# Patient Record
Sex: Female | Born: 1960
Health system: Southern US, Community
[De-identification: ages and names within clinical notes are randomized; demographics above are authoritative.]

## PROBLEM LIST (undated history)

## (undated) DIAGNOSIS — M722 Plantar fascial fibromatosis: Secondary | ICD-10-CM

## (undated) DIAGNOSIS — F99 Mental disorder, not otherwise specified: Secondary | ICD-10-CM

## (undated) DIAGNOSIS — R112 Nausea with vomiting, unspecified: Secondary | ICD-10-CM

## (undated) DIAGNOSIS — F419 Anxiety disorder, unspecified: Secondary | ICD-10-CM

## (undated) DIAGNOSIS — K219 Gastro-esophageal reflux disease without esophagitis: Secondary | ICD-10-CM

## (undated) DIAGNOSIS — Z9889 Other specified postprocedural states: Secondary | ICD-10-CM

---

## 1998-07-20 ENCOUNTER — Emergency Department (HOSPITAL_COMMUNITY): Admission: EM | Admit: 1998-07-20 | Discharge: 1998-07-20 | Payer: Self-pay | Admitting: Emergency Medicine

## 2001-04-16 ENCOUNTER — Encounter: Admission: RE | Admit: 2001-04-16 | Discharge: 2001-04-16 | Payer: Self-pay | Admitting: Family Medicine

## 2001-04-16 ENCOUNTER — Encounter: Payer: Self-pay | Admitting: Family Medicine

## 2001-08-02 ENCOUNTER — Encounter: Payer: Self-pay | Admitting: Family Medicine

## 2001-08-02 ENCOUNTER — Ambulatory Visit (HOSPITAL_COMMUNITY): Admission: RE | Admit: 2001-08-02 | Discharge: 2001-08-02 | Payer: Self-pay | Admitting: Family Medicine

## 2001-08-07 ENCOUNTER — Encounter: Payer: Self-pay | Admitting: Family Medicine

## 2001-08-07 ENCOUNTER — Ambulatory Visit (HOSPITAL_COMMUNITY): Admission: RE | Admit: 2001-08-07 | Discharge: 2001-08-07 | Payer: Self-pay | Admitting: Family Medicine

## 2002-12-05 ENCOUNTER — Other Ambulatory Visit: Admission: RE | Admit: 2002-12-05 | Discharge: 2002-12-05 | Payer: Self-pay | Admitting: Family Medicine

## 2003-02-04 ENCOUNTER — Encounter: Admission: RE | Admit: 2003-02-04 | Discharge: 2003-02-04 | Payer: Self-pay | Admitting: Family Medicine

## 2003-02-04 ENCOUNTER — Encounter: Payer: Self-pay | Admitting: Family Medicine

## 2003-04-24 ENCOUNTER — Encounter: Admission: RE | Admit: 2003-04-24 | Discharge: 2003-04-24 | Payer: Self-pay | Admitting: Family Medicine

## 2003-04-24 ENCOUNTER — Encounter: Payer: Self-pay | Admitting: Family Medicine

## 2004-02-19 ENCOUNTER — Other Ambulatory Visit: Admission: RE | Admit: 2004-02-19 | Discharge: 2004-02-19 | Payer: Self-pay | Admitting: Family Medicine

## 2004-05-06 ENCOUNTER — Encounter: Admission: RE | Admit: 2004-05-06 | Discharge: 2004-05-06 | Payer: Self-pay | Admitting: Family Medicine

## 2005-04-07 ENCOUNTER — Other Ambulatory Visit: Admission: RE | Admit: 2005-04-07 | Discharge: 2005-04-07 | Payer: Self-pay | Admitting: Family Medicine

## 2005-04-13 ENCOUNTER — Encounter: Admission: RE | Admit: 2005-04-13 | Discharge: 2005-04-13 | Payer: Self-pay | Admitting: Family Medicine

## 2006-06-02 ENCOUNTER — Other Ambulatory Visit: Admission: RE | Admit: 2006-06-02 | Discharge: 2006-06-02 | Payer: Self-pay | Admitting: Family Medicine

## 2006-06-21 ENCOUNTER — Encounter: Admission: RE | Admit: 2006-06-21 | Discharge: 2006-06-21 | Payer: Self-pay | Admitting: Family Medicine

## 2009-06-03 ENCOUNTER — Other Ambulatory Visit: Admission: RE | Admit: 2009-06-03 | Discharge: 2009-06-03 | Payer: Self-pay | Admitting: Family Medicine

## 2010-11-14 ENCOUNTER — Encounter: Payer: Self-pay | Admitting: Family Medicine

## 2011-03-28 ENCOUNTER — Other Ambulatory Visit: Payer: Self-pay | Admitting: *Deleted

## 2011-03-28 ENCOUNTER — Other Ambulatory Visit: Payer: Self-pay | Admitting: Family Medicine

## 2011-03-28 DIAGNOSIS — Z1231 Encounter for screening mammogram for malignant neoplasm of breast: Secondary | ICD-10-CM

## 2011-04-14 ENCOUNTER — Ambulatory Visit: Payer: Self-pay

## 2011-05-18 ENCOUNTER — Ambulatory Visit (HOSPITAL_BASED_OUTPATIENT_CLINIC_OR_DEPARTMENT_OTHER)
Admission: RE | Admit: 2011-05-18 | Payer: BC Managed Care – PPO | Source: Ambulatory Visit | Admitting: Orthopedic Surgery

## 2011-08-22 ENCOUNTER — Other Ambulatory Visit: Payer: Self-pay | Admitting: Physician Assistant

## 2011-08-22 ENCOUNTER — Other Ambulatory Visit (HOSPITAL_COMMUNITY)
Admission: RE | Admit: 2011-08-22 | Discharge: 2011-08-22 | Disposition: A | Discharge status: Home / Self Care | Payer: BC Managed Care – PPO | Source: Ambulatory Visit | Attending: Family Medicine | Admitting: Family Medicine

## 2011-08-22 DIAGNOSIS — Z0000 Encounter for general adult medical examination without abnormal findings: Secondary | ICD-10-CM | POA: Insufficient documentation

## 2012-04-09 ENCOUNTER — Ambulatory Visit: Payer: BC Managed Care – PPO

## 2012-07-09 ENCOUNTER — Other Ambulatory Visit: Payer: Self-pay | Admitting: Family Medicine

## 2012-07-09 DIAGNOSIS — Z1231 Encounter for screening mammogram for malignant neoplasm of breast: Secondary | ICD-10-CM

## 2012-07-11 ENCOUNTER — Ambulatory Visit
Admission: RE | Admit: 2012-07-11 | Discharge: 2012-07-11 | Disposition: A | Discharge status: Home / Self Care | Payer: BC Managed Care – PPO | Source: Ambulatory Visit | Attending: Family Medicine | Admitting: Family Medicine

## 2012-07-11 DIAGNOSIS — Z1231 Encounter for screening mammogram for malignant neoplasm of breast: Secondary | ICD-10-CM

## 2013-04-09 ENCOUNTER — Encounter (HOSPITAL_COMMUNITY): Payer: Self-pay | Admitting: Pharmacy Technician

## 2013-04-09 NOTE — Progress Notes (Signed)
Surgery scheduled for 04/15/13.  Preop 04/12/13 at 1100.  Need orders in EPIC.  Thank You.

## 2013-04-09 NOTE — Progress Notes (Signed)
Need orders in EPIC.  Patient scheduled for surgery on 04/15/13.  Thank You.

## 2013-04-10 ENCOUNTER — Other Ambulatory Visit: Payer: Self-pay | Admitting: Orthopedic Surgery

## 2013-04-10 MED ORDER — DEXAMETHASONE SODIUM PHOSPHATE 10 MG/ML IJ SOLN: 10.0000 mg | Freq: Once | INTRAMUSCULAR | Status: DC

## 2013-04-11 ENCOUNTER — Other Ambulatory Visit (HOSPITAL_COMMUNITY): Payer: Self-pay | Admitting: Orthopedic Surgery

## 2013-04-11 NOTE — Patient Instructions (Addendum)
20 JHADA RISK  04/11/2013   Your procedure is scheduled on: 04-15-13  Report to [facility] at 145 pm  Call this number if you have problems the morning of surgery [phone]   Remember:   Do not eat food  :After Midnight.   clear liquids midnight until 1015 am day of surgery, then nothing by mouth.   Take these medicines the morning of surgery with A SIP OF WATER: effexor, ativan if needed, allegra if needed                                SEE [facility] PREPARING FOR SURGERY SHEET   Do not wear jewelry, make-up or nail polish.  Do not wear lotions, powders, or perfumes. You may wear deodorant.   Men may shave face and neck.  Do not bring valuables to the hospital. [facility] IS NOT RESPONSIBLE FOR VALUEABLES.  Contacts, dentures or bridgework may not be worn into surgery.  Leave suitcase in the car. After surgery it may be brought to your room.  For patients admitted to the hospital, checkout time is 11:00 AM the day of discharge.   Patients discharged the day of surgery will not be allowed to drive home.  Name and phone number of your driver:  Special Instructions: N/A   Please read over the following fact sheets that you were given: MRSA Information, incentive spirometer fact sheet  Call Cain Sieve RN pre op nurse if needed [phone]    FAILURE TO FOLLOW THESE INSTRUCTIONS MAY RESULT IN THE CANCELLATION OF YOUR SURGERY.  PATIENT SIGNATURE___________________________________________  NURSE SIGNATURE_____________________________________________

## 2013-04-12 ENCOUNTER — Encounter (HOSPITAL_COMMUNITY): Payer: Self-pay

## 2013-04-12 ENCOUNTER — Encounter (HOSPITAL_COMMUNITY)
Admission: RE | Admit: 2013-04-12 | Discharge: 2013-04-12 | Disposition: A | Discharge status: Home / Self Care | Payer: BC Managed Care – PPO | Source: Ambulatory Visit | Attending: Orthopedic Surgery | Admitting: Orthopedic Surgery

## 2013-04-12 HISTORY — DX: Gastro-esophageal reflux disease without esophagitis: K21.9

## 2013-04-12 HISTORY — DX: Plantar fascial fibromatosis: M72.2

## 2013-04-12 HISTORY — DX: Mental disorder, not otherwise specified: F99

## 2013-04-12 HISTORY — DX: Anxiety disorder, unspecified: F41.9

## 2013-04-12 HISTORY — DX: Other specified postprocedural states: R11.2

## 2013-04-12 HISTORY — DX: Nausea with vomiting, unspecified: Z98.890

## 2013-04-12 LAB — CBC
MCH: 27.4 pg (ref 26.0–34.0)
MCHC: 33.1 g/dL (ref 30.0–36.0)
MCV: 82.9 fL (ref 78.0–100.0)
Platelets: 201 10*3/uL (ref 150–400)
RBC: 4.74 MIL/uL (ref 3.87–5.11)
RDW: 13.1 % (ref 11.5–15.5)
WBC: 4 10*3/uL (ref 4.0–10.5)

## 2013-04-12 LAB — SURGICAL PCR SCREEN
MRSA, PCR: NEGATIVE
Staphylococcus aureus: NEGATIVE

## 2013-04-14 NOTE — H&P (Signed)
  CC- SHEYANNE MUNLEY is a 52 y.o. female who presents with bilateral knee pain.  HPI- . Knee Pain: Patient presents with knee pain involving the  bilateral knees. Onset of the symptoms was several months ago. Inciting event: none known. Current symptoms include giving out, pain located medially (both knees), popping sensation and stiffness. Pain is aggravated by going up and down stairs, kneeling, pivoting, rising after sitting, running and squatting.  Patient has had no prior knee problems. Evaluation to date: MRI: abnormal Bilateral medial meniscal tears. Treatment to date: rest.  Past Medical History  Diagnosis Date  . Anxiety   . Mental disorder   . GERD (gastroesophageal reflux disease)     occasional  . Plantar fasciitis of right foot     has orthotics for shoe  . PONV (postoperative nausea and vomiting) 20 yrs ago    had to be put all way to sleep     Past Surgical History  Procedure Laterality Date  . Cesarean section  20 yrs ago    Prior to Admission medications   Medication Sig Start Date End Date Taking? Authorizing Provider  beta carotene w/minerals (OCUVITE) tablet Take 1 tablet by mouth daily.    Historical Provider, MD  cholecalciferol (VITAMIN D) 1000 UNITS tablet Take 1,000 Units by mouth daily.    Historical Provider, MD  fexofenadine (ALLEGRA) 180 MG tablet Take 180 mg by mouth as needed.     Historical Provider, MD  GLUCOSAMINE-CHONDROITIN PO Take 1 tablet by mouth daily.    Historical Provider, MD  Boris Lown Oil 500 MG CAPS Take 1 capsule by mouth daily.    Historical Provider, MD  LORazepam (ATIVAN) 1 MG tablet Take 1 mg by mouth every 6 (six) hours as needed for anxiety.    Historical Provider, MD  naproxen sodium (ANAPROX) 220 MG tablet Take 440 mg by mouth daily.    Historical Provider, MD  venlafaxine XR (EFFEXOR-XR) 75 MG 24 hr capsule Take 75 mg by mouth every morning.    Historical Provider, MD  vitamin B-12 (CYANOCOBALAMIN) 1000 MCG tablet Take 1,000 mcg by  mouth daily.    Historical Provider, MD   Bilateral Knee Exam antalgic gait, soft tissue tenderness over medial joint line bilateral knees, negative drawer sign, collateral ligaments intact, normal ipsilateral hip exam  Physical Examination: General appearance - alert, well appearing, and in no distress Mental status - alert, oriented to person, place, and time Chest - clear to auscultation, no wheezes, rales or rhonchi, symmetric air entry Heart - normal rate, regular rhythm, normal S1, S2, no murmurs, rubs, clicks or gallops Abdomen - soft, nontender, nondistended, no masses or organomegaly Musculoskeletal - no joint tenderness, deformity or swelling   Asessment/Plan--- Bilateral knee medial meniscal tears- - Plan Bilateral knee arthroscopy with meniscal debridement. Procedure risks and potential comps discussed with patient who elects to proceed. Goals are decreased pain and increased function with a high likelihood of achieving both

## 2013-04-15 ENCOUNTER — Observation Stay (HOSPITAL_COMMUNITY)
Admission: RE | Admit: 2013-04-15 | Discharge: 2013-04-17 | Disposition: A | Discharge status: Home / Self Care | Payer: BC Managed Care – PPO | Source: Ambulatory Visit | Attending: Orthopedic Surgery | Admitting: Orthopedic Surgery

## 2013-04-15 ENCOUNTER — Encounter (HOSPITAL_COMMUNITY)
Admission: RE | Disposition: A | Discharge status: Home / Self Care | Payer: Self-pay | Source: Ambulatory Visit | Attending: Orthopedic Surgery

## 2013-04-15 ENCOUNTER — Encounter (HOSPITAL_COMMUNITY): Payer: Self-pay | Admitting: *Deleted

## 2013-04-15 ENCOUNTER — Ambulatory Visit (HOSPITAL_COMMUNITY): Payer: BC Managed Care – PPO | Admitting: Anesthesiology

## 2013-04-15 ENCOUNTER — Encounter (HOSPITAL_COMMUNITY): Payer: Self-pay | Admitting: Anesthesiology

## 2013-04-15 DIAGNOSIS — M23329 Other meniscus derangements, posterior horn of medial meniscus, unspecified knee: Principal | ICD-10-CM | POA: Insufficient documentation

## 2013-04-15 DIAGNOSIS — S83249A Other tear of medial meniscus, current injury, unspecified knee, initial encounter: Secondary | ICD-10-CM

## 2013-04-15 DIAGNOSIS — K219 Gastro-esophageal reflux disease without esophagitis: Secondary | ICD-10-CM | POA: Insufficient documentation

## 2013-04-15 DIAGNOSIS — M2390 Unspecified internal derangement of unspecified knee: Secondary | ICD-10-CM | POA: Insufficient documentation

## 2013-04-15 DIAGNOSIS — M2340 Loose body in knee, unspecified knee: Secondary | ICD-10-CM | POA: Insufficient documentation

## 2013-04-15 DIAGNOSIS — Z01812 Encounter for preprocedural laboratory examination: Secondary | ICD-10-CM | POA: Insufficient documentation

## 2013-04-15 DIAGNOSIS — IMO0002 Reserved for concepts with insufficient information to code with codable children: Secondary | ICD-10-CM | POA: Insufficient documentation

## 2013-04-15 DIAGNOSIS — Z9889 Other specified postprocedural states: Secondary | ICD-10-CM

## 2013-04-15 DIAGNOSIS — X58XXXA Exposure to other specified factors, initial encounter: Secondary | ICD-10-CM | POA: Insufficient documentation

## 2013-04-15 DIAGNOSIS — Z79899 Other long term (current) drug therapy: Secondary | ICD-10-CM | POA: Insufficient documentation

## 2013-04-15 HISTORY — PX: KNEE ARTHROSCOPY: SHX127

## 2013-04-15 HISTORY — DX: Other tear of medial meniscus, current injury, unspecified knee, initial encounter: S83.249A

## 2013-04-15 SURGERY — ARTHROSCOPY, KNEE, BILATERAL
Anesthesia: General | Site: Knee | Laterality: Bilateral | Wound class: Clean

## 2013-04-15 MED ORDER — CEFAZOLIN SODIUM-DEXTROSE 2-3 GM-% IV SOLR
2.0000 g | INTRAVENOUS | Status: AC
  Administered 2013-04-15: 2 g via INTRAVENOUS

## 2013-04-15 MED ORDER — LIDOCAINE HCL (CARDIAC) 20 MG/ML IV SOLN
INTRAVENOUS | Status: DC | PRN
  Administered 2013-04-15: 75 mg via INTRAVENOUS

## 2013-04-15 MED ORDER — OXYCODONE HCL 5 MG PO TABS
5.0000 mg | ORAL_TABLET | ORAL | Status: DC | PRN
  Administered 2013-04-15 – 2013-04-17 (×12): 10 mg via ORAL
  Filled 2013-04-15 (×13): qty 2

## 2013-04-15 MED ORDER — FENTANYL CITRATE 0.05 MG/ML IJ SOLN: 25.0000 ug | INTRAMUSCULAR | Status: DC | PRN

## 2013-04-15 MED ORDER — LORAZEPAM 1 MG PO TABS
1.0000 mg | ORAL_TABLET | Freq: Four times a day (QID) | ORAL | Status: DC | PRN
  Administered 2013-04-15 – 2013-04-16 (×2): 1 mg via ORAL
  Filled 2013-04-15 (×3): qty 1

## 2013-04-15 MED ORDER — BUPIVACAINE-EPINEPHRINE 0.25% -1:200000 IJ SOLN
INTRAMUSCULAR | Status: AC
  Filled 2013-04-15: qty 1

## 2013-04-15 MED ORDER — METOCLOPRAMIDE HCL 10 MG PO TABS: 5.0000 mg | ORAL_TABLET | Freq: Three times a day (TID) | ORAL | Status: DC | PRN

## 2013-04-15 MED ORDER — ONDANSETRON HCL 4 MG/2ML IJ SOLN
INTRAMUSCULAR | Status: DC | PRN
  Administered 2013-04-15 (×2): 2 mg via INTRAVENOUS

## 2013-04-15 MED ORDER — LACTATED RINGERS IV SOLN
INTRAVENOUS | Status: DC | PRN
  Administered 2013-04-15 (×2): via INTRAVENOUS

## 2013-04-15 MED ORDER — MORPHINE SULFATE 2 MG/ML IJ SOLN
1.0000 mg | INTRAMUSCULAR | Status: DC | PRN
  Administered 2013-04-16: 1 mg via INTRAVENOUS
  Filled 2013-04-15: qty 1

## 2013-04-15 MED ORDER — ONDANSETRON HCL 4 MG/2ML IJ SOLN: 4.0000 mg | Freq: Four times a day (QID) | INTRAMUSCULAR | Status: DC | PRN

## 2013-04-15 MED ORDER — MEPERIDINE HCL 50 MG/ML IJ SOLN
INTRAMUSCULAR | Status: AC
  Filled 2013-04-15: qty 1

## 2013-04-15 MED ORDER — CITRIC ACID-SODIUM CITRATE 334-500 MG/5ML PO SOLN
30.0000 mL | Freq: Once | ORAL | Status: AC
  Administered 2013-04-15: 30 mL via ORAL
  Filled 2013-04-15: qty 30

## 2013-04-15 MED ORDER — MIDAZOLAM HCL 5 MG/5ML IJ SOLN
INTRAMUSCULAR | Status: DC | PRN
  Administered 2013-04-15 (×2): 1 mg via INTRAVENOUS

## 2013-04-15 MED ORDER — SODIUM CHLORIDE 0.9 % IV SOLN
INTRAVENOUS | Status: DC
  Administered 2013-04-15: 19:00:00 via INTRAVENOUS

## 2013-04-15 MED ORDER — CITRIC ACID-SODIUM CITRATE 334-500 MG/5ML PO SOLN
ORAL | Status: AC
  Filled 2013-04-15: qty 15

## 2013-04-15 MED ORDER — METOCLOPRAMIDE HCL 5 MG/ML IJ SOLN: 5.0000 mg | Freq: Three times a day (TID) | INTRAMUSCULAR | Status: DC | PRN

## 2013-04-15 MED ORDER — LACTATED RINGERS IR SOLN
Status: DC | PRN
  Administered 2013-04-15 (×3): 3000 mL
  Administered 2013-04-15 (×2): 6000 mL

## 2013-04-15 MED ORDER — PROPOFOL INFUSION 10 MG/ML OPTIME
INTRAVENOUS | Status: DC | PRN
  Administered 2013-04-15: 75 ug/kg/min via INTRAVENOUS

## 2013-04-15 MED ORDER — LACTATED RINGERS IV SOLN
INTRAVENOUS | Status: DC
  Administered 2013-04-15: 1000 mL via INTRAVENOUS

## 2013-04-15 MED ORDER — LORATADINE 10 MG PO TABS
10.0000 mg | ORAL_TABLET | Freq: Every day | ORAL | Status: DC
  Administered 2013-04-16 – 2013-04-17 (×2): 10 mg via ORAL
  Filled 2013-04-15 (×2): qty 1

## 2013-04-15 MED ORDER — METOCLOPRAMIDE HCL 5 MG/ML IJ SOLN
INTRAMUSCULAR | Status: DC | PRN
  Administered 2013-04-15: 5 mg via INTRAVENOUS

## 2013-04-15 MED ORDER — DEXAMETHASONE SODIUM PHOSPHATE 10 MG/ML IJ SOLN
INTRAMUSCULAR | Status: DC | PRN
  Administered 2013-04-15: 10 mg via INTRAVENOUS

## 2013-04-15 MED ORDER — CEFAZOLIN SODIUM-DEXTROSE 2-3 GM-% IV SOLR
INTRAVENOUS | Status: AC
  Filled 2013-04-15: qty 50

## 2013-04-15 MED ORDER — MEPERIDINE HCL 50 MG/ML IJ SOLN
6.2500 mg | INTRAMUSCULAR | Status: DC | PRN
  Administered 2013-04-15 (×2): 12.5 mg via INTRAVENOUS

## 2013-04-15 MED ORDER — SODIUM CHLORIDE 0.9 % IV SOLN: INTRAVENOUS | Status: DC

## 2013-04-15 MED ORDER — BUPIVACAINE-EPINEPHRINE PF 0.25-1:200000 % IJ SOLN
INTRAMUSCULAR | Status: DC | PRN
  Administered 2013-04-15: 20 mL

## 2013-04-15 MED ORDER — SUCCINYLCHOLINE CHLORIDE 20 MG/ML IJ SOLN
INTRAMUSCULAR | Status: DC | PRN
  Administered 2013-04-15: 100 mg via INTRAVENOUS

## 2013-04-15 MED ORDER — PROMETHAZINE HCL 25 MG/ML IJ SOLN: 6.2500 mg | INTRAMUSCULAR | Status: DC | PRN

## 2013-04-15 MED ORDER — FENTANYL CITRATE 0.05 MG/ML IJ SOLN
INTRAMUSCULAR | Status: DC | PRN
  Administered 2013-04-15 (×3): 50 ug via INTRAVENOUS
  Administered 2013-04-15: 100 ug via INTRAVENOUS
  Administered 2013-04-15 (×3): 50 ug via INTRAVENOUS

## 2013-04-15 MED ORDER — METHOCARBAMOL 100 MG/ML IJ SOLN
500.0000 mg | Freq: Four times a day (QID) | INTRAMUSCULAR | Status: DC | PRN
  Filled 2013-04-15: qty 5

## 2013-04-15 MED ORDER — METHOCARBAMOL 500 MG PO TABS
500.0000 mg | ORAL_TABLET | Freq: Four times a day (QID) | ORAL | Status: DC | PRN
  Administered 2013-04-16 – 2013-04-17 (×4): 500 mg via ORAL
  Filled 2013-04-15 (×4): qty 1

## 2013-04-15 MED ORDER — ACETAMINOPHEN 10 MG/ML IV SOLN
INTRAVENOUS | Status: AC
  Filled 2013-04-15: qty 100

## 2013-04-15 MED ORDER — ONDANSETRON HCL 4 MG PO TABS: 4.0000 mg | ORAL_TABLET | Freq: Four times a day (QID) | ORAL | Status: DC | PRN

## 2013-04-15 MED ORDER — ACETAMINOPHEN 10 MG/ML IV SOLN: 1000.0000 mg | Freq: Once | INTRAVENOUS | Status: DC

## 2013-04-15 MED ORDER — LACTATED RINGERS IV SOLN: INTRAVENOUS | Status: DC

## 2013-04-15 MED ORDER — ENOXAPARIN SODIUM 40 MG/0.4ML ~~LOC~~ SOLN
40.0000 mg | SUBCUTANEOUS | Status: DC
  Administered 2013-04-16: 40 mg via SUBCUTANEOUS
  Filled 2013-04-15 (×3): qty 0.4

## 2013-04-15 MED ORDER — VENLAFAXINE HCL ER 75 MG PO CP24
75.0000 mg | ORAL_CAPSULE | Freq: Every morning | ORAL | Status: DC
  Administered 2013-04-16 – 2013-04-17 (×2): 75 mg via ORAL
  Filled 2013-04-15 (×2): qty 1

## 2013-04-15 SURGICAL SUPPLY — 34 items
BANDAGE ELASTIC 6 VELCRO ST LF (GAUZE/BANDAGES/DRESSINGS) ×4 IMPLANT
BLADE 4.2CUDA (BLADE) IMPLANT
BNDG COHESIVE 6X5 TAN STRL LF (GAUZE/BANDAGES/DRESSINGS) ×2 IMPLANT
CLOTH BEACON ORANGE TIMEOUT ST (SAFETY) ×2 IMPLANT
CUFF TOURN SGL QUICK 34 (TOURNIQUET CUFF) ×2
CUFF TRNQT CYL 34X4X40X1 (TOURNIQUET CUFF) ×2 IMPLANT
DECANTER SPIKE VIAL GLASS SM (MISCELLANEOUS) ×2 IMPLANT
DRAPE EXTREMITY BILATERAL (DRAPE) ×2 IMPLANT
DRAPE STERI 35X30 U-POUCH (DRAPES) ×4 IMPLANT
DRSG ADAPTIC 3X8 NADH LF (GAUZE/BANDAGES/DRESSINGS) ×4 IMPLANT
DRSG EMULSION OIL 3X3 NADH (GAUZE/BANDAGES/DRESSINGS) ×4 IMPLANT
DRSG PAD ABDOMINAL 8X10 ST (GAUZE/BANDAGES/DRESSINGS) ×4 IMPLANT
DURAPREP 26ML APPLICATOR (WOUND CARE) ×4 IMPLANT
ELECT REM PT RETURN 9FT ADLT (ELECTROSURGICAL) ×2
ELECTRODE REM PT RTRN 9FT ADLT (ELECTROSURGICAL) ×1 IMPLANT
GLOVE BIO SURGEON STRL SZ7.5 (GLOVE) ×4 IMPLANT
GLOVE BIO SURGEON STRL SZ8 (GLOVE) ×4 IMPLANT
GLOVE INDICATOR 8.0 STRL GRN (GLOVE) ×4 IMPLANT
GOWN STRL REIN XL XLG (GOWN DISPOSABLE) ×4 IMPLANT
MANIFOLD NEPTUNE II (INSTRUMENTS) ×4 IMPLANT
PACK ARTHROSCOPY WL (CUSTOM PROCEDURE TRAY) ×2 IMPLANT
PAD CAST 4YDX4 CTTN HI CHSV (CAST SUPPLIES) ×2 IMPLANT
PADDING CAST COTTON 4X4 STRL (CAST SUPPLIES) ×2
PADDING CAST COTTON 6X4 STRL (CAST SUPPLIES) ×4 IMPLANT
POSITIONER SURGICAL ARM (MISCELLANEOUS) ×2 IMPLANT
SET ARTHROSCOPY TUBING (MISCELLANEOUS) ×1
SET ARTHROSCOPY TUBING LN (MISCELLANEOUS) ×1 IMPLANT
SPONGE GAUZE 4X4 12PLY (GAUZE/BANDAGES/DRESSINGS) ×4 IMPLANT
SUT ETHILON 4 0 PS 2 18 (SUTURE) ×4 IMPLANT
SYR 20CC LL (SYRINGE) ×2 IMPLANT
TOWEL OR 17X26 10 PK STRL BLUE (TOWEL DISPOSABLE) ×2 IMPLANT
TUBING CONNECTING 10 (TUBING) ×2 IMPLANT
WAND 90 DEG TURBOVAC W/CORD (SURGICAL WAND) ×2 IMPLANT
WRAP KNEE MAXI GEL POST OP (GAUZE/BANDAGES/DRESSINGS) ×4 IMPLANT

## 2013-04-15 NOTE — Transfer of Care (Signed)
Immediate Anesthesia Transfer of Care Note  Patient: Elizabeth Harvey  Procedure(s) Performed: Procedure(s) with comments: ARTHROSCOPY KNEE BILATERAL WITH DEBRIDEMENT (Bilateral) - Medial meniscal debridement and chondroplasty bilateral knees  Patient Location: PACU  Anesthesia Type:General  Level of Consciousness: awake, alert , oriented and patient cooperative  Airway & Oxygen Therapy: Patient Spontanous Breathing and Patient connected to face mask oxygen  Post-op Assessment: Report given to PACU RN, Post -op Vital signs reviewed and stable and Patient moving all extremities  Post vital signs: Reviewed and stable  Complications: No apparent anesthesia complications

## 2013-04-15 NOTE — Preoperative (Signed)
Beta Blockers   Reason not to administer Beta Blockers:Not Applicable 

## 2013-04-15 NOTE — Anesthesia Postprocedure Evaluation (Signed)
  Anesthesia Post-op Note  Patient: Elizabeth Harvey  Procedure(s) Performed: Procedure(s) (LRB): ARTHROSCOPY KNEE BILATERAL WITH DEBRIDEMENT (Bilateral)  Patient Location: PACU  Anesthesia Type: General  Level of Consciousness: awake and alert   Airway and Oxygen Therapy: Patient Spontanous Breathing  Post-op Pain: mild  Post-op Assessment: Post-op Vital signs reviewed, Patient's Cardiovascular Status Stable, Respiratory Function Stable, Patent Airway and No signs of Nausea or vomiting  Last Vitals:  Filed Vitals:   04/15/13 1803  BP:   Pulse: 97  Temp: 36.8 C  Resp: 13    Post-op Vital Signs: stable   Complications: No apparent anesthesia complications \

## 2013-04-15 NOTE — Anesthesia Preprocedure Evaluation (Signed)
Anesthesia Evaluation  Patient identified by MRN, date of birth, ID band Patient awake    Reviewed: Allergy & Precautions, H&P , NPO status , Patient's Chart, lab work & pertinent test results  History of Anesthesia Complications Negative for: history of anesthetic complications  Airway Mallampati: II TM Distance: >3 FB Neck ROM: Full    Dental no notable dental hx.    Pulmonary neg pulmonary ROS,  breath sounds clear to auscultation  Pulmonary exam normal       Cardiovascular negative cardio ROS  Rhythm:Regular Rate:Normal     Neuro/Psych negative neurological ROS  negative psych ROS   GI/Hepatic Neg liver ROS, GERD-  Poorly Controlled,  Endo/Other  negative endocrine ROS  Renal/GU negative Renal ROS  negative genitourinary   Musculoskeletal negative musculoskeletal ROS (+)   Abdominal   Peds negative pediatric ROS (+)  Hematology negative hematology ROS (+)   Anesthesia Other Findings   Reproductive/Obstetrics negative OB ROS                           Anesthesia Physical Anesthesia Plan  ASA: II  Anesthesia Plan: General   Post-op Pain Management:    Induction: Intravenous, Rapid sequence and Cricoid pressure planned  Airway Management Planned: Oral ETT  Additional Equipment:   Intra-op Plan:   Post-operative Plan: Extubation in OR  Informed Consent: I have reviewed the patients History and Physical, chart, labs and discussed the procedure including the risks, benefits and alternatives for the proposed anesthesia with the patient or authorized representative who has indicated his/her understanding and acceptance.   Dental advisory given  Plan Discussed with: CRNA  Anesthesia Plan Comments:         Anesthesia Quick Evaluation

## 2013-04-15 NOTE — Brief Op Note (Signed)
04/15/2013  5:47 PM  PATIENT:  Elizabeth Harvey  52 y.o. female  PRE-OPERATIVE DIAGNOSIS:  Bilateral Meniscal Tear  POST-OPERATIVE DIAGNOSIS:  Bilateral Meniscal Tear  PROCEDURE:  Procedure(s) with comments: ARTHROSCOPY KNEE BILATERAL WITH DEBRIDEMENT (Bilateral) - Medial meniscal debridement and chondroplasty bilateral knees  SURGEON:  Surgeon(s) and Role:    * Loanne Drilling, MD - Primary  PHYSICIAN ASSISTANT:   ASSISTANTS: none   ANESTHESIA:   general  EBL:  Total I/O In: 1000 [I.V.:1000] Out: 150 [Other:150]   LOCAL MEDICATIONS USED:  MARCAINE     COUNTS:  YES  TOURNIQUET:    DICTATION: .Other Dictation: Dictation Number [id number]  PLAN OF CARE: Admit for overnight observation  PATIENT DISPOSITION:  PACU - hemodynamically stable.

## 2013-04-15 NOTE — Interval H&P Note (Signed)
History and Physical Interval Note:  04/15/2013 3:52 PM  Elizabeth Harvey  has presented today for surgery, with the diagnosis of Bilateral Meniscal Tear  The various methods of treatment have been discussed with the patient and family. After consideration of risks, benefits and other options for treatment, the patient has consented to  Procedure(s): ARTHROSCOPY KNEE BILATERAL WITH DEBRIDEMENT (Bilateral) as a surgical intervention .  The patient's history has been reviewed, patient examined, no change in status, stable for surgery.  I have reviewed the patient's chart and labs.  Questions were answered to the patient's satisfaction.     Loanne Drilling

## 2013-04-15 NOTE — Op Note (Signed)
Elizabeth Harvey, Elizabeth Harvey                 ACCOUNT NO.:  [id number]  MEDICAL RECORD NO.:  [id number]  LOCATION:  1601                         FACILITY:  [facility]  PHYSICIAN:  Ollen Gross, M.D.    DATE OF BIRTH:  Dec 19, 1960  DATE OF PROCEDURE:  04/15/2013 DATE OF DISCHARGE:                              OPERATIVE REPORT   PREOPERATIVE DIAGNOSIS:  Bilateral knee medial meniscal tear.  POSTOPERATIVE DIAGNOSIS:  Bilateral knee medial meniscal tear plus chondral defects, bilateral knees.  PROCEDURE:  Bilateral knee arthroscopy with medial meniscal debridement and chondroplasty.  SURGEON:  Ollen Gross, M.D.  ASSISTANT:  No assistant.  ANESTHESIA:  General.  ESTIMATED BLOOD LOSS:  Minimal.  DRAINS:  None.  COMPLICATIONS:  None.  CONDITION:  Stable to recovery.  BRIEF CLINICAL NOTE:  Elizabeth Harvey is a 52 year old female, who had a long history of pain and mechanical symptoms in her right knee.  Exam suggested medial meniscal tear, which was confirmed by MRI.  We decided to proceed with a right knee arthroscopy, but then recently she has injured her left knee with significant medial pain and mechanical symptoms.  Recent exam also suggested a medial meniscal tear, which was confirmed on MRI.  Both knees are hurting badly with the left somewhat worse than the right.  She presents now for bilateral knee arthroscopy and debridement.  PROCEDURE IN DETAIL:  After successful administration of general anesthetic, tourniquet was placed high on both thighs, and both lower extremities were prepped and draped in the usual sterile fashion.  We decided to do the left side first since that was more acute.  Standard superomedial and inferolateral incisions were made, inflow cannula passed, superomedial camera passed inferolateral and then arthroscopic visualization proceeded.  Undersurface of patella and trochlea looked normal on first glance, but then looking closer at the inferomedial aspect of the  trochlea was about a 1 x 2 cm area of chondral delamination.  We then visualized the medial lateral gutters.  There were no loose bodies.  Flexion of valgus force was applied to the knee, and the medial compartment was entered.  There was evidence of an unstable tear of the posterior horn of the medial meniscus.  The spinal needle was used to localize the inferomedial portal.  Small incision was made and dilator placed.  The meniscal tear was debrided back to a stable base with baskets and a 4.5 mm shaver.  The tear was then sealed off with the ArthroCare device.  The medial femoral condyle and tibial plateau looked normal.  The intercondylar notch was visualized.  The ACL was normal.  Lateral compartment centered and it looks normal.  We then addressed the chondral defect in the trochlea.  It is debrided back to a stable base with the shaver and then the bony defect was about 1 x 1 cm and that was abraded to give the bleeding surface to lead to formation of fibrocartilage.  The edges of the defect were stable.  Joints were again inspected.  No other tears, defects, or loose bodies were noted. The arthroscopic equipment was then removed from the inferior portals, which were closed by interrupted 4-0 nylon.  20 mL of 0.25%  Marcaine epi injected inflow cannula and that was removed and that portal was closed with nylon.  We then addressed the right knee.  A fresh blade was used make a superomedial inferolateral incision, and an inflow cannula passed, superomedial camera passed inferolateral.  Arthroscopic visualization proceeded.  Undersurface of the patella showed a small chondral defect. It was not unstable.  There was a loose body and an unstable defect that tethered to the superomedial aspect of the trochlea.  The medial lateral and gutters were visualized.  There were no loose bodies.  Flexion and valgus force was applied to the knee and the medial compartment was centered.  Evidence  of an unstable tear of the posterior horn of the medial meniscus.  This was debrided back to stable base with baskets and a 4.2 mm shaver and sealed off with the ArthroCare device.  There were no chondral defects in the medial compartment.  Intercondylar notch was visualized.  The ACL was normal.  Lateral compartment centered and it looked normal.  The patellofemoral joint was again addressed.  A grasper was used to remove the loose body, which had been tethered to the trochlea.  We then used a shaver to debride the trochlear defect, which was about 2 cm in length and about 5 mm in width.  This was debrided to stable bony base with stable cartilaginous edges.  It was probed and found to be stable.  The undersurface of the patella was also probed and was stable.  The joints were again inspected.  No other tears, defects, or loose bodies noted.  Arthroscopic equipments were removed from the inferior portals, which were closed with interrupted 4-0 nylon.  The 20 mL of 0.25% Marcaine with epi was injected with inflow cannula in and out.  Portals closed with nylon.  The incisions were cleaned and dried and on both knees, a bulky sterile dressing was applied.  She was then awakened and transported to recovery in stable condition.     Ollen Gross, M.D.     FA/MEDQ  D:  04/15/2013  T:  04/15/2013  Job:  [id number]

## 2013-04-16 ENCOUNTER — Encounter (HOSPITAL_COMMUNITY): Payer: Self-pay | Admitting: Orthopedic Surgery

## 2013-04-16 LAB — BASIC METABOLIC PANEL
BUN: 7 mg/dL (ref 6–23)
Creatinine, Ser: 0.59 mg/dL (ref 0.50–1.10)
GFR calc Af Amer: >90 mL/min (ref >90)
GFR calc non Af Amer: >90 mL/min (ref >90)
Potassium: 3.9 mEq/L (ref 3.5–5.1)

## 2013-04-16 MED ORDER — RIVAROXABAN 10 MG PO TABS
10.0000 mg | ORAL_TABLET | Freq: Every day | ORAL | Status: DC
  Administered 2013-04-17: 10 mg via ORAL
  Filled 2013-04-16: qty 1

## 2013-04-16 MED ORDER — METHOCARBAMOL 500 MG PO TABS: 500.0000 mg | ORAL_TABLET | Freq: Four times a day (QID) | ORAL | Status: DC | PRN

## 2013-04-16 MED ORDER — OXYCODONE HCL 5 MG PO TABS: 5.0000 mg | ORAL_TABLET | ORAL | Status: DC | PRN

## 2013-04-16 MED ORDER — RIVAROXABAN 10 MG PO TABS: 10.0000 mg | ORAL_TABLET | Freq: Every day | ORAL | Status: DC

## 2013-04-16 MED ORDER — KETOROLAC TROMETHAMINE 30 MG/ML IJ SOLN
30.0000 mg | Freq: Once | INTRAMUSCULAR | Status: AC
  Administered 2013-04-16: 30 mg via INTRAVENOUS
  Filled 2013-04-16: qty 1

## 2013-04-16 NOTE — Discharge Summary (Signed)
Physician Discharge Summary   Patient ID: Elizabeth Harvey MRN: [id number] DOB/AGE: 03/28/1961 52 y.o.  Admit date: 04/15/2013 Discharge date: 04/16/2013  Primary Diagnosis:  Bilateral Meniscal Tear  Admission Diagnoses:  Past Medical History  Diagnosis Date  . Anxiety   . Mental disorder   . GERD (gastroesophageal reflux disease)     occasional  . Plantar fasciitis of right foot     has orthotics for shoe  . PONV (postoperative nausea and vomiting) 20 yrs ago    had to be put all way to sleep    Discharge Diagnoses:   Principal Problem:   Acute medial meniscal tear  Estimated body mass index is 36.28 kg/(m^2) as calculated from the following:   Height as of this encounter: 5\' 5"  (1.651 m).   Weight as of this encounter: 98.884 kg (218 lb).  Procedure:  Procedure(s) (LRB): ARTHROSCOPY KNEE BILATERAL WITH DEBRIDEMENT (Bilateral)   Consults: None  HPI: Elizabeth Harvey is a 52 year old female, who had a long  history of pain and mechanical symptoms in her right knee. Exam  suggested medial meniscal tear, which was confirmed by MRI. We decided  to proceed with a right knee arthroscopy, but then recently she has  injured her left knee with significant medial pain and mechanical  symptoms. Recent exam also suggested a medial meniscal tear, which was  confirmed on MRI. Both knees are hurting badly with the left somewhat  worse than the right. She presents now for bilateral knee arthroscopy  and debridement.  Laboratory Data: Hospital Outpatient Visit on 04/12/2013  Component Date Value Range Status  . WBC 04/12/2013 4.0  4.0 - 10.5 K/uL Final  . RBC 04/12/2013 4.74  3.87 - 5.11 MIL/uL Final  . Hemoglobin 04/12/2013 13.0  12.0 - 15.0 g/dL Final  . HCT 14/78/2956 39.3  36.0 - 46.0 % Final  . MCV 04/12/2013 82.9  78.0 - 100.0 fL Final  . MCH 04/12/2013 27.4  26.0 - 34.0 pg Final  . MCHC 04/12/2013 33.1  30.0 - 36.0 g/dL Final  . RDW 21/30/8657 13.1  11.5 - 15.5 % Final  . Platelets  04/12/2013 201  150 - 400 K/uL Final  . MRSA, PCR 04/12/2013 NEGATIVE  NEGATIVE Final  . Staphylococcus aureus 04/12/2013 NEGATIVE  NEGATIVE Final   Comment:                                 The Xpert SA Assay (FDA                          approved for NASAL specimens                          in patients over 105 years of age),                          is one component of                          a comprehensive surveillance                          program.  Test performance has  been validated by [facility] for patients greater                          than or equal to 72 year old.                          It is not intended                          to diagnose infection nor to                          guide or monitor treatment.     X-Rays:No results found.  EKG:No orders found for this or any previous visit.   Hospital Course: Elizabeth Harvey is a 52 y.o. who was admitted to [facility]. They were brought to the operating room on 04/15/2013 and underwent Procedure(s): ARTHROSCOPY KNEE BILATERAL WITH DEBRIDEMENT.  Patient tolerated the procedure well and was later transferred to the recovery room and then to the orthopaedic floor for postoperative care.  They were given PO and IV analgesics for pain control following their surgery.  They were given 24 hours of postoperative antibiotics of  Anti-infectives   Start     Dose/Rate Route Frequency Ordered Stop   04/15/13 1400  ceFAZolin (ANCEF) IVPB 2 g/50 mL premix     2 g 100 mL/hr over 30 Minutes Intravenous On call to O.R. 04/15/13 1311 04/15/13 1615     and started on DVT prophylaxis in the form of Lovenox.  Discharge planning consulted to help with postop disposition and equipment needs.  Patient had a decent night on the evening of surgery.  Family in room with patient.  They started to get up OOB on day one. Patient was seen in rounds on POD 1 by Dr. Lequita Halt and she was ready  to go home later that morning.  Surgical findings were discussed with the patient.  Instructions were given.   Discharge Medications: Prior to Admission medications   Medication Sig Start Date End Date Taking? Authorizing Provider  beta carotene w/minerals (OCUVITE) tablet Take 1 tablet by mouth daily.   Yes Historical Provider, MD  cholecalciferol (VITAMIN D) 1000 UNITS tablet Take 1,000 Units by mouth daily.   Yes Historical Provider, MD  fexofenadine (ALLEGRA) 180 MG tablet Take 180 mg by mouth as needed.    Yes Historical Provider, MD  GLUCOSAMINE-CHONDROITIN PO Take 1 tablet by mouth daily.   Yes Historical Provider, MD  Boris Lown Oil 500 MG CAPS Take 1 capsule by mouth daily.   Yes Historical Provider, MD  LORazepam (ATIVAN) 1 MG tablet Take 1 mg by mouth every 6 (six) hours as needed for anxiety.   Yes Historical Provider, MD  venlafaxine XR (EFFEXOR-XR) 75 MG 24 hr capsule Take 75 mg by mouth every morning.   Yes Historical Provider, MD  vitamin B-12 (CYANOCOBALAMIN) 1000 MCG tablet Take 1,000 mcg by mouth daily.   Yes Historical Provider, MD  methocarbamol (ROBAXIN) 500 MG tablet Take 1 tablet (500 mg total) by mouth every 6 (six) hours as needed. 04/16/13   Alexzandrew Perkins, PA-C  oxyCODONE (OXY IR/ROXICODONE) 5 MG immediate release tablet Take 1-2 tablets (5-10 mg  total) by mouth every 3 (three) hours as needed. 04/16/13   Alexzandrew Julien Girt, PA-C  rivaroxaban (XARELTO) 10 MG TABS tablet Take 1 tablet (10 mg total) by mouth daily. Take Xarelto for one week, then discontinue Xarelto. Once the patient has completed the Xarelto, then take an 81 mg Aspirin for four weeks. 04/17/13   Alexzandrew Julien Girt, PA-C    Diet: Regular diet Activity:WBAT Follow-up:in 7 days Disposition - Home Discharged Condition: good   Discharge Orders   Future Orders Complete By Expires     Call MD / Call 911  As directed     Comments:      If you experience chest pain or shortness of breath, CALL 911 and  be transported to the hospital emergency room.  If you develope a fever above 101 F, pus (white drainage) or increased drainage or redness at the wound, or calf pain, call your surgeon's office.    Change dressing  As directed     Comments:      Change dressing daily with sterile 4 x 4 inch gauze dressing and apply TED hose. Do not submerge the incision under water.    Constipation Prevention  As directed     Comments:      Drink plenty of fluids.  Prune juice may be helpful.  You may use a stool softener, such as Colace (over the counter) 100 mg twice a day.  Use MiraLax (over the counter) for constipation as needed.    Diet general  As directed     Discharge instructions  As directed     Comments:      Pick up stool softner and laxative for home. Do not submerge incision under water. May shower starting Wednesday afternoon. Continue to use ice for pain and swelling from surgery.  Take Xarelto for one week, then discontinue Xarelto. Once the patient has completed the Xarelto, then take an 81 mg Aspirin for four weeks.  Begin knee exercises provided at time of discharge.    Do not sit on low chairs, stoools or toilet seats, as it may be difficult to get up from low surfaces  As directed     Driving restrictions  As directed     Comments:      No driving until released by the physician.    Increase activity slowly as tolerated  As directed     Lifting restrictions  As directed     Comments:      No lifting until released by the physician.    Patient may shower  As directed     Comments:      You may shower without a dressing once there is no drainage.  Do not wash over the wound.  If drainage remains, do not shower until drainage stops.    TED hose  As directed     Comments:      Use stockings (TED hose) for 3 weeks on both leg(s).  You may remove them at night for sleeping.    Weight bearing as tolerated  As directed         Medication List    STOP taking these medications        naproxen sodium 220 MG tablet  Commonly known as:  ANAPROX      TAKE these medications       beta carotene w/minerals tablet  Take 1 tablet by mouth daily.     cholecalciferol 1000 UNITS tablet  Commonly known as:  VITAMIN D  Take 1,000 Units by mouth daily.     fexofenadine 180 MG tablet  Commonly known as:  ALLEGRA  Take 180 mg by mouth as needed.     GLUCOSAMINE-CHONDROITIN PO  Take 1 tablet by mouth daily.     Krill Oil 500 MG Caps  Take 1 capsule by mouth daily.     LORazepam 1 MG tablet  Commonly known as:  ATIVAN  Take 1 mg by mouth every 6 (six) hours as needed for anxiety.     methocarbamol 500 MG tablet  Commonly known as:  ROBAXIN  Take 1 tablet (500 mg total) by mouth every 6 (six) hours as needed.     oxyCODONE 5 MG immediate release tablet  Commonly known as:  Oxy IR/ROXICODONE  Take 1-2 tablets (5-10 mg total) by mouth every 3 (three) hours as needed.     rivaroxaban 10 MG Tabs tablet  Commonly known as:  XARELTO  Take 1 tablet (10 mg total) by mouth daily. Take Xarelto for one week, then discontinue Xarelto.  Once the patient has completed the Xarelto, then take an 81 mg Aspirin for four weeks.  Start taking on:  04/17/2013     venlafaxine XR 75 MG 24 hr capsule  Commonly known as:  EFFEXOR-XR  Take 75 mg by mouth every morning.     vitamin B-12 1000 MCG tablet  Commonly known as:  CYANOCOBALAMIN  Take 1,000 mcg by mouth daily.           Follow-up Information   Follow up with Loanne Drilling, MD. Schedule an appointment as soon as possible for a visit on 04/23/2013. (Call for appointment time.)    Contact information:   [address] [place] [address] [phone]       Signed: Patrica Duel 04/16/2013, 8:25 AM

## 2013-04-16 NOTE — Progress Notes (Signed)
   Subjective: 1 Day Post-Op Procedure(s) (LRB): ARTHROSCOPY KNEE BILATERAL WITH DEBRIDEMENT (Bilateral) Patient reports pain as mild and moderate.   Patient seen in rounds with Dr. Lequita Halt.  Family in room.  Briefly discussed the surgical findings.  Patient is well, and has had no acute complaints or problems Patient is ready to go home later this morning.  Objective: Vital signs in last 24 hours: Temp:  [97.9 F (36.6 C)-98.9 F (37.2 C)] 98.3 F (36.8 C) (06/24 0620) Pulse Rate:  [82-102] 83 (06/24 0620) Resp:  [10-18] 16 (06/24 0620) BP: (90-152)/(54-85) 104/60 mmHg (06/24 0620) SpO2:  [95 %-100 %] 97 % (06/24 0620) Weight:  [98.884 kg (218 lb)] 98.884 kg (218 lb) (06/23 2324)  Intake/Output from previous day:  Intake/Output Summary (Last 24 hours) at 04/16/13 0740 Last data filed at 04/16/13 0700  Gross per 24 hour  Intake   2690 ml  Output   2700 ml  Net    -10 ml     Labs: No results found for this basename: HGB,  in the last 72 hours No results found for this basename: WBC, RBC, HCT, PLT,  in the last 72 hours No results found for this basename: NA, K, CL, CO2, BUN, CREATININE, GLUCOSE, CALCIUM,  in the last 72 hours No results found for this basename: LABPT, INR,  in the last 72 hours  EXAM: General - Patient is Alert, Appropriate and Oriented Extremity - Neurovascular intact Sensation intact distally Dorsiflexion/Plantar flexion intact Dressings - clean, dry, no drainage to both knees Motor Function - intact, moving feet and toes well on exam.   Assessment/Plan: 1 Day Post-Op Procedure(s) (LRB): ARTHROSCOPY KNEE BILATERAL WITH DEBRIDEMENT (Bilateral) Procedure(s) (LRB): ARTHROSCOPY KNEE BILATERAL WITH DEBRIDEMENT (Bilateral) Past Medical History  Diagnosis Date  . Anxiety   . Mental disorder   . GERD (gastroesophageal reflux disease)     occasional  . Plantar fasciitis of right foot     has orthotics for shoe  . PONV (postoperative nausea and  vomiting) 20 yrs ago    had to be put all way to sleep    Principal Problem:   Acute medial meniscal tear  Estimated body mass index is 36.28 kg/(m^2) as calculated from the following:   Height as of this encounter: 5\' 5"  (1.651 m).   Weight as of this encounter: 98.884 kg (218 lb). Discharge home Diet - Regular diet Follow up - in 1 week, next Tuesday.  Call for appointmenmt Activity - WBAT to both legs Disposition - Home Condition Upon Discharge - Good D/C Meds - See DC Summary DVT Prophylaxis - Lovenox injection today, then Xarelto for seven days, the Baby Aspirin 81 mg daily for four more weeks.  PERKINS, ALEXZANDREW 04/16/2013, 7:40 AM

## 2013-04-17 MED ORDER — DOCUSATE SODIUM 100 MG PO CAPS
100.0000 mg | ORAL_CAPSULE | Freq: Once | ORAL | Status: AC
  Administered 2013-04-17: 100 mg via ORAL

## 2013-04-17 MED ORDER — POLYETHYLENE GLYCOL 3350 17 G PO PACK
17.0000 g | PACK | Freq: Once | ORAL | Status: AC
  Administered 2013-04-17: 17 g via ORAL

## 2013-04-17 NOTE — Evaluation (Signed)
Physical Therapy Evaluation Patient Details Name: Elizabeth Harvey MRN: [id number] DOB: 08-May-1961 Today's Date: 04/17/2013 Time: 0950-1030 PT Time Calculation (min): 40 min  PT Assessment / Plan / Recommendation Clinical Impression  Pt s/p bil knee arthroscopy presents with decreased ROM/strength Bil LEs and post op pain limiting functional mobility.  Pt mobilizing at Sup level and to d/c home this date with family assist    PT Assessment  Patent does not need any further PT services    Follow Up Recommendations  No PT follow up    Does the patient have the potential to tolerate intense rehabilitation      Barriers to Discharge        Equipment Recommendations  Rolling walker with 5" wheels    Recommendations for Other Services     Frequency      Precautions / Restrictions Precautions Precautions: Fall Restrictions Weight Bearing Restrictions: No   Pertinent Vitals/Pain 4/10; premed, cold packs provided      Mobility  Bed Mobility Bed Mobility: Supine to Sit Supine to Sit: 5: Supervision Transfers Transfers: Sit to Stand;Stand to Sit Sit to Stand: 4: Min guard;5: Supervision Stand to Sit: 4: Min guard;5: Supervision Details for Transfer Assistance: cues for LE management and use of UEs to self assist Ambulation/Gait Ambulation/Gait Assistance: 4: Min guard;5: Supervision Ambulation Distance (Feet): 75 Feet (and 30) Assistive device: Rolling walker Ambulation/Gait Assistance Details: cues for sequence, posture and position from RW Gait Pattern: Step-to pattern;Step-through pattern Stairs: Yes Stairs Assistance: 4: Min assist Stairs Assistance Details (indicate cue type and reason): cues for sequence and foot/crutch placement Stair Management Technique: One rail Right;Step to pattern;Forwards;With crutches Number of Stairs: 4    Exercises Total Joint Exercises Ankle Circles/Pumps: AROM;15 reps;Supine;Both Quad Sets: AAROM;Supine;10 reps;Both Heel Slides:  AAROM;5 reps;Supine;Both   PT Diagnosis:    PT Problem List:   PT Treatment Interventions:       PT Goals(Current goals can be found in the care plan section) Acute Rehab PT Goals Patient Stated Goal: Be able to squat down to garden PT Goal Formulation: No goals set, d/c therapy  Visit Information  Last PT Received On: 04/17/13 Assistance Needed: +1 History of Present Illness: Bil knee arthroscopy       Prior Functioning  Home Living Family/patient expects to be discharged to:: Private residence Living Arrangements: Spouse/significant other Available Help at Discharge: Family Type of Home: House Home Access: Stairs to enter Secretary/administrator of Steps: 3 Entrance Stairs-Rails: Right;Left Home Layout: Able to live on main level with bedroom/bathroom Home Equipment: None Prior Function Level of Independence: Independent Communication Communication: No difficulties    Cognition  Cognition Arousal/Alertness: Awake/alert Behavior During Therapy: WFL for tasks assessed/performed Overall Cognitive Status: Within Functional Limits for tasks assessed    Extremity/Trunk Assessment Upper Extremity Assessment Upper Extremity Assessment: Overall WFL for tasks assessed Lower Extremity Assessment Lower Extremity Assessment: RLE deficits/detail;LLE deficits/detail RLE Deficits / Details: AAROM at knee to 90 flex with 3+/5 quads LLE Deficits / Details: AAROM to 40 with 3-/5 quads Cervical / Trunk Assessment Cervical / Trunk Assessment: Normal   Balance    End of Session PT - End of Session Equipment Utilized During Treatment: Gait belt Activity Tolerance: Patient tolerated treatment well Patient left: in chair;with call bell/phone within reach;with family/visitor present Nurse Communication: Mobility status  GP Functional Assessment Tool Used: Clinical judgement Functional Limitation: Mobility: Walking and moving around Mobility: Walking and Moving Around Current Status  ([id number]): At least 20 percent but less  than 40 percent impaired, limited or restricted Mobility: Walking and Moving Around Goal Status [phone]): At least 20 percent but less than 40 percent impaired, limited or restricted Mobility: Walking and Moving Around Discharge Status [phone]): At least 20 percent but less than 40 percent impaired, limited or restricted   Alka Falwell 04/17/2013, 12:48 PM

## 2013-08-23 ENCOUNTER — Other Ambulatory Visit: Payer: Self-pay | Admitting: Family Medicine

## 2013-08-23 ENCOUNTER — Other Ambulatory Visit (HOSPITAL_COMMUNITY)
Admission: RE | Admit: 2013-08-23 | Discharge: 2013-08-23 | Disposition: A | Discharge status: Home / Self Care | Payer: BC Managed Care – PPO | Source: Ambulatory Visit | Attending: Family Medicine | Admitting: Family Medicine

## 2013-08-23 DIAGNOSIS — Z0000 Encounter for general adult medical examination without abnormal findings: Secondary | ICD-10-CM | POA: Insufficient documentation

## 2016-10-24 HISTORY — PX: REDUCTION MAMMAPLASTY: SUR839

## 2017-01-05 ENCOUNTER — Other Ambulatory Visit: Payer: Self-pay | Admitting: Family Medicine

## 2017-01-05 ENCOUNTER — Ambulatory Visit
Admission: RE | Admit: 2017-01-05 | Discharge: 2017-01-05 | Disposition: A | Discharge status: Home / Self Care | Payer: BLUE CROSS/BLUE SHIELD | Source: Ambulatory Visit | Attending: Family Medicine | Admitting: Family Medicine

## 2017-01-05 DIAGNOSIS — Z1231 Encounter for screening mammogram for malignant neoplasm of breast: Secondary | ICD-10-CM

## 2017-02-22 NOTE — H&P (Signed)
  Subjective:     Patient ID: Elizabeth Harvey is a 56 y.o. female.   Here for follow up discussion prior to planned breast reduction. Current 42C/D cup and has several year history neck and back pain. This has failed to improve with PT, specialty bra, prescription and OTC medication, heat and cold packs. Notes numbness bilateral hands with fine work such as needle work, worse on right. Reports heat rashes beneath breasts more than 2-3 times per year largely during warm weather and uses prescription antifungal powder for this.   Has been participating in a supervised weight loss program through her insurance called [employer]. This included weight loss coach, nutritionist or diet changes and regular exercise.   Last MMG 12/2016 normal. No family history breast or ovarian ca.  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  Review of Systems    Objective:   Physical Exam  Constitutional: She is oriented to person, place, and time.  Cardiovascular: Normal rate, regular rhythm and normal heart sounds.   Pulmonary/Chest: Effort normal and breath sounds normal.  Lymphadenopathy:    She has no axillary adenopathy.  Neurological: She is oriented to person, place, and time.  Skin:  Fitzpatrick 2   Breast no masses   grade 3 ptosis bilateral, right> left volume +shoulder grooving SN to nipple R 34 L 34 cm BW R 23 L 23 cm Nipple to IMF R 11 L 12 cm Assessment:     Macromastia Chronic neck and back pain    Plan:     Reviewed reduction with anchor type scars, possible overnight hospital stay, use of drain, post operative visits and limitations, recovery. Diminished sensation nipple and breast skin, risk of nipple loss, wound healing problems, asymmetry, incidental carcinoma, changes with wt gain/loss, aging, unacceptable cosmetic appearance reviewed. Counseled cannot assure cup size. Reviewed  placement NAC at level of IMF. Additional risks including but not limited to fat necrosis, seroma, hematoma, damage to deeper structures, DVT/PE, cardiopulmonary complications, unacceptable cosmetic result reviewed.   Anticipate 700 g reduction from each breast.     Irene Limbo, MD [facility] [phone], pin [phone]

## 2017-02-24 ENCOUNTER — Encounter (HOSPITAL_BASED_OUTPATIENT_CLINIC_OR_DEPARTMENT_OTHER): Payer: Self-pay | Admitting: *Deleted

## 2017-03-01 NOTE — Progress Notes (Signed)
Pt. Received Boost drink with instructions to be completed by 0530 am DOS. NPO otherwise. Pt verbalized understanding.

## 2017-03-03 ENCOUNTER — Encounter (HOSPITAL_BASED_OUTPATIENT_CLINIC_OR_DEPARTMENT_OTHER)
Admission: RE | Disposition: A | Discharge status: Home / Self Care | Payer: Self-pay | Source: Ambulatory Visit | Attending: Plastic Surgery

## 2017-03-03 ENCOUNTER — Ambulatory Visit (HOSPITAL_BASED_OUTPATIENT_CLINIC_OR_DEPARTMENT_OTHER)
Admission: RE | Admit: 2017-03-03 | Discharge: 2017-03-03 | Disposition: A | Discharge status: Home / Self Care | Payer: BLUE CROSS/BLUE SHIELD | Source: Ambulatory Visit | Attending: Plastic Surgery | Admitting: Plastic Surgery

## 2017-03-03 ENCOUNTER — Encounter (HOSPITAL_BASED_OUTPATIENT_CLINIC_OR_DEPARTMENT_OTHER): Payer: Self-pay | Admitting: *Deleted

## 2017-03-03 ENCOUNTER — Ambulatory Visit (HOSPITAL_BASED_OUTPATIENT_CLINIC_OR_DEPARTMENT_OTHER): Payer: BLUE CROSS/BLUE SHIELD | Admitting: Anesthesiology

## 2017-03-03 DIAGNOSIS — N62 Hypertrophy of breast: Secondary | ICD-10-CM | POA: Diagnosis not present

## 2017-03-03 DIAGNOSIS — Z79899 Other long term (current) drug therapy: Secondary | ICD-10-CM | POA: Insufficient documentation

## 2017-03-03 DIAGNOSIS — G8929 Other chronic pain: Secondary | ICD-10-CM | POA: Insufficient documentation

## 2017-03-03 DIAGNOSIS — M542 Cervicalgia: Secondary | ICD-10-CM | POA: Diagnosis not present

## 2017-03-03 DIAGNOSIS — M549 Dorsalgia, unspecified: Secondary | ICD-10-CM | POA: Diagnosis not present

## 2017-03-03 HISTORY — PX: BREAST REDUCTION SURGERY: SHX8

## 2017-03-03 SURGERY — MAMMOPLASTY, REDUCTION
Anesthesia: General | Site: Breast | Laterality: Bilateral

## 2017-03-03 MED ORDER — ROCURONIUM BROMIDE 100 MG/10ML IV SOLN
INTRAVENOUS | Status: DC | PRN
  Administered 2017-03-03: 20 mg via INTRAVENOUS
  Administered 2017-03-03: 50 mg via INTRAVENOUS
  Administered 2017-03-03: 30 mg via INTRAVENOUS

## 2017-03-03 MED ORDER — CHLORHEXIDINE GLUCONATE CLOTH 2 % EX PADS: 6.0000 | MEDICATED_PAD | Freq: Once | CUTANEOUS | Status: DC

## 2017-03-03 MED ORDER — GABAPENTIN 300 MG PO CAPS
300.0000 mg | ORAL_CAPSULE | ORAL | Status: AC
  Administered 2017-03-03: 300 mg via ORAL

## 2017-03-03 MED ORDER — SODIUM CHLORIDE 0.9 % IJ SOLN
INTRAMUSCULAR | Status: AC
  Filled 2017-03-03: qty 10

## 2017-03-03 MED ORDER — ONDANSETRON HCL 4 MG/2ML IJ SOLN
INTRAMUSCULAR | Status: DC | PRN
Start: 2017-03-03 — End: 2017-03-03
  Administered 2017-03-03: 4 mg via INTRAVENOUS

## 2017-03-03 MED ORDER — MEPERIDINE HCL 25 MG/ML IJ SOLN
6.2500 mg | INTRAMUSCULAR | Status: DC | PRN
Start: 2017-03-03 — End: 2017-03-03

## 2017-03-03 MED ORDER — FENTANYL CITRATE (PF) 100 MCG/2ML IJ SOLN
INTRAMUSCULAR | Status: AC
  Filled 2017-03-03: qty 2

## 2017-03-03 MED ORDER — LIDOCAINE HCL (CARDIAC) 20 MG/ML IV SOLN
INTRAVENOUS | Status: DC | PRN
  Administered 2017-03-03: 80 mg via INTRAVENOUS

## 2017-03-03 MED ORDER — 0.9 % SODIUM CHLORIDE (POUR BTL) OPTIME
TOPICAL | Status: DC | PRN
  Administered 2017-03-03: 500 mL

## 2017-03-03 MED ORDER — PROPOFOL 10 MG/ML IV BOLUS
INTRAVENOUS | Status: AC
Start: 2017-03-03 — End: 2017-03-03
  Filled 2017-03-03: qty 20

## 2017-03-03 MED ORDER — LACTATED RINGERS IV SOLN
INTRAVENOUS | Status: DC
Start: 2017-03-03 — End: 2017-03-03
  Administered 2017-03-03 (×3): via INTRAVENOUS

## 2017-03-03 MED ORDER — OXYCODONE HCL 5 MG PO TABS: 5.0000 mg | ORAL_TABLET | ORAL | 0 refills | Status: AC | PRN

## 2017-03-03 MED ORDER — FENTANYL CITRATE (PF) 100 MCG/2ML IJ SOLN
25.0000 ug | INTRAMUSCULAR | Status: DC | PRN
  Administered 2017-03-03 (×2): 50 ug via INTRAVENOUS
  Administered 2017-03-03 (×2): 25 ug via INTRAVENOUS

## 2017-03-03 MED ORDER — PROPOFOL 10 MG/ML IV BOLUS
INTRAVENOUS | Status: DC | PRN
  Administered 2017-03-03: 200 mg via INTRAVENOUS

## 2017-03-03 MED ORDER — METOCLOPRAMIDE HCL 5 MG/ML IJ SOLN
10.0000 mg | Freq: Once | INTRAMUSCULAR | Status: DC | PRN
Start: 2017-03-03 — End: 2017-03-03

## 2017-03-03 MED ORDER — GABAPENTIN 300 MG PO CAPS
ORAL_CAPSULE | ORAL | Status: AC
  Filled 2017-03-03: qty 1

## 2017-03-03 MED ORDER — ACETAMINOPHEN 500 MG PO TABS
1000.0000 mg | ORAL_TABLET | ORAL | Status: AC
  Administered 2017-03-03: 1000 mg via ORAL

## 2017-03-03 MED ORDER — MIDAZOLAM HCL 2 MG/2ML IJ SOLN
1.0000 mg | INTRAMUSCULAR | Status: DC | PRN
  Administered 2017-03-03: 2 mg via INTRAVENOUS

## 2017-03-03 MED ORDER — SUGAMMADEX SODIUM 200 MG/2ML IV SOLN
INTRAVENOUS | Status: DC | PRN
  Administered 2017-03-03: 250 mg via INTRAVENOUS

## 2017-03-03 MED ORDER — CEFAZOLIN SODIUM-DEXTROSE 2-4 GM/100ML-% IV SOLN
2.0000 g | INTRAVENOUS | Status: AC
  Administered 2017-03-03: 2 g via INTRAVENOUS

## 2017-03-03 MED ORDER — MIDAZOLAM HCL 2 MG/2ML IJ SOLN
INTRAMUSCULAR | Status: AC
  Filled 2017-03-03: qty 2

## 2017-03-03 MED ORDER — FENTANYL CITRATE (PF) 100 MCG/2ML IJ SOLN: 50.0000 ug | INTRAMUSCULAR | Status: DC | PRN

## 2017-03-03 MED ORDER — SCOPOLAMINE 1 MG/3DAYS TD PT72: 1.0000 | MEDICATED_PATCH | Freq: Once | TRANSDERMAL | Status: DC | PRN

## 2017-03-03 MED ORDER — SUFENTANIL CITRATE 50 MCG/ML IV SOLN
INTRAVENOUS | Status: DC | PRN
  Administered 2017-03-03 (×2): 10 ug via INTRAVENOUS
  Administered 2017-03-03: 5 ug via INTRAVENOUS

## 2017-03-03 MED ORDER — SODIUM CHLORIDE 0.9 % IV SOLN
INTRAVENOUS | Status: DC | PRN
  Administered 2017-03-03: 40 mL

## 2017-03-03 MED ORDER — SUGAMMADEX SODIUM 500 MG/5ML IV SOLN
INTRAVENOUS | Status: AC
  Filled 2017-03-03: qty 5

## 2017-03-03 MED ORDER — CELECOXIB 200 MG PO CAPS
ORAL_CAPSULE | ORAL | Status: AC
  Filled 2017-03-03: qty 1

## 2017-03-03 MED ORDER — CELECOXIB 400 MG PO CAPS
400.0000 mg | ORAL_CAPSULE | ORAL | Status: AC
  Administered 2017-03-03: 400 mg via ORAL

## 2017-03-03 MED ORDER — DEXAMETHASONE SODIUM PHOSPHATE 4 MG/ML IJ SOLN
INTRAMUSCULAR | Status: DC | PRN
  Administered 2017-03-03: 10 mg via INTRAVENOUS

## 2017-03-03 MED ORDER — LACTATED RINGERS IV SOLN: INTRAVENOUS | Status: DC

## 2017-03-03 MED ORDER — CEFAZOLIN SODIUM-DEXTROSE 2-4 GM/100ML-% IV SOLN
INTRAVENOUS | Status: AC
  Filled 2017-03-03: qty 100

## 2017-03-03 MED ORDER — SUFENTANIL CITRATE 50 MCG/ML IV SOLN
INTRAVENOUS | Status: AC
  Filled 2017-03-03: qty 1

## 2017-03-03 MED ORDER — ACETAMINOPHEN 500 MG PO TABS
ORAL_TABLET | ORAL | Status: AC
  Filled 2017-03-03: qty 2

## 2017-03-03 MED ORDER — EPHEDRINE SULFATE 50 MG/ML IJ SOLN
INTRAMUSCULAR | Status: DC | PRN
  Administered 2017-03-03: 10 mg via INTRAVENOUS

## 2017-03-03 SURGICAL SUPPLY — 50 items
APPLIER CLIP 9.375 MED OPEN (MISCELLANEOUS)
BINDER BREAST 3XL (BIND) IMPLANT
BINDER BREAST LRG (GAUZE/BANDAGES/DRESSINGS) IMPLANT
BINDER BREAST MEDIUM (GAUZE/BANDAGES/DRESSINGS) IMPLANT
BINDER BREAST XLRG (GAUZE/BANDAGES/DRESSINGS) IMPLANT
BINDER BREAST XXLRG (GAUZE/BANDAGES/DRESSINGS) ×3 IMPLANT
BLADE SURG 10 STRL SS (BLADE) ×12 IMPLANT
BNDG GAUZE ELAST 4 BULKY (GAUZE/BANDAGES/DRESSINGS) ×6 IMPLANT
CANISTER SUCT 1200ML W/VALVE (MISCELLANEOUS) ×3 IMPLANT
CHLORAPREP W/TINT 26ML (MISCELLANEOUS) ×9 IMPLANT
CLIP APPLIE 9.375 MED OPEN (MISCELLANEOUS) IMPLANT
COVER BACK TABLE 60X90IN (DRAPES) ×3 IMPLANT
COVER MAYO STAND STRL (DRAPES) ×3 IMPLANT
DRAIN CHANNEL 15F RND FF W/TCR (WOUND CARE) ×6 IMPLANT
DRAPE TOP ARMCOVERS (MISCELLANEOUS) ×3 IMPLANT
DRAPE U-SHAPE 76X120 STRL (DRAPES) ×3 IMPLANT
DRSG PAD ABDOMINAL 8X10 ST (GAUZE/BANDAGES/DRESSINGS) ×6 IMPLANT
ELECT COATED BLADE 2.86 ST (ELECTRODE) ×3 IMPLANT
ELECT REM PT RETURN 9FT ADLT (ELECTROSURGICAL) ×3
ELECTRODE REM PT RTRN 9FT ADLT (ELECTROSURGICAL) ×1 IMPLANT
EVACUATOR SILICONE 100CC (DRAIN) ×6 IMPLANT
GLOVE BIO SURGEON STRL SZ 6 (GLOVE) ×6 IMPLANT
GLOVE BIOGEL PI IND STRL 7.0 (GLOVE) ×1 IMPLANT
GLOVE BIOGEL PI IND STRL 8 (GLOVE) ×1 IMPLANT
GLOVE BIOGEL PI INDICATOR 7.0 (GLOVE) ×2
GLOVE BIOGEL PI INDICATOR 8 (GLOVE) ×2
GLOVE SURG SS PI 6.5 STRL IVOR (GLOVE) ×6 IMPLANT
GOWN STRL REUS W/ TWL LRG LVL3 (GOWN DISPOSABLE) ×2 IMPLANT
GOWN STRL REUS W/TWL LRG LVL3 (GOWN DISPOSABLE) ×4
LIQUID BAND (GAUZE/BANDAGES/DRESSINGS) ×6 IMPLANT
NS IRRIG 1000ML POUR BTL (IV SOLUTION) ×3 IMPLANT
PACK BASIN DAY SURGERY FS (CUSTOM PROCEDURE TRAY) ×3 IMPLANT
PENCIL BUTTON HOLSTER BLD 10FT (ELECTRODE) ×3 IMPLANT
PIN SAFETY STERILE (MISCELLANEOUS) ×3 IMPLANT
SHEET MEDIUM DRAPE 40X70 STRL (DRAPES) ×3 IMPLANT
SLEEVE SCD COMPRESS KNEE MED (MISCELLANEOUS) ×3 IMPLANT
SPONGE LAP 18X18 X RAY DECT (DISPOSABLE) ×9 IMPLANT
STAPLER VISISTAT 35W (STAPLE) ×6 IMPLANT
SUT ETHILON 2 0 FS 18 (SUTURE) ×3 IMPLANT
SUT MNCRL AB 4-0 PS2 18 (SUTURE) ×12 IMPLANT
SUT VIC AB 3-0 PS1 18 (SUTURE) ×12
SUT VIC AB 3-0 PS1 18XBRD (SUTURE) ×6 IMPLANT
SUT VICRYL 4-0 PS2 18IN ABS (SUTURE) ×6 IMPLANT
SYR BULB IRRIGATION 50ML (SYRINGE) ×3 IMPLANT
TAPE MEASURE VINYL STERILE (MISCELLANEOUS) IMPLANT
TOWEL OR 17X24 6PK STRL BLUE (TOWEL DISPOSABLE) ×6 IMPLANT
TUBE CONNECTING 20'X1/4 (TUBING) ×1
TUBE CONNECTING 20X1/4 (TUBING) ×2 IMPLANT
UNDERPAD 30X30 (UNDERPADS AND DIAPERS) IMPLANT
YANKAUER SUCT BULB TIP NO VENT (SUCTIONS) ×3 IMPLANT

## 2017-03-03 NOTE — Discharge Instructions (Signed)
About my Jackson-Pratt Bulb Drain ? ?What is a Jackson-Pratt bulb? ?A Jackson-Pratt is a soft, round device used to collect drainage. It is connected to a long, thin drainage catheter, which is held in place by one or two small stiches near your surgical incision site. When the bulb is squeezed, it forms a vacuum, forcing the drainage to empty into the bulb. ? ?Emptying the Jackson-Pratt bulb- ?To empty the bulb: ?1. Release the plug on the top of the bulb. ?2. Pour the bulb's contents into a measuring container which your nurse will provide. ?3. Record the time emptied and amount of drainage. Empty the drain(s) as often as your     doctor or nurse recommends. ? ?Date                  Time                    Amount (Drain 1)                 Amount (Drain 2) ? ?_____________________________________________________________________ ? ?_____________________________________________________________________ ? ?_____________________________________________________________________ ? ?_____________________________________________________________________ ? ?_____________________________________________________________________ ? ?_____________________________________________________________________ ? ?_____________________________________________________________________ ? ?_____________________________________________________________________ ? ?Squeezing the Jackson-Pratt Bulb- ?To squeeze the bulb: ?1. Make sure the plug at the top of the bulb is open. ?2. Squeeze the bulb tightly in your fist. You will hear air squeezing from the bulb. ?3. Replace the plug while the bulb is squeezed. ?4. Use a safety pin to attach the bulb to your clothing. This will keep the catheter from     pulling at the bulb insertion site. ? ?When to call your doctor- ?Call your doctor if: ?Drain site becomes red, swollen or hot. ?You have a fever greater than 101 degrees F. ?There is oozing at the drain site. ?Drain falls out (apply a guaze bandage  over the drain hole and secure it with tape). ?Drainage increases daily not related to activity patterns. (You will usually have more drainage when you are active than when you are resting.) ?Drainage has a bad odor. ? ? ?Post Anesthesia Home Care Instructions ? ?Activity: ?Get plenty of rest for the remainder of the day. A responsible individual must stay with you for 24 hours following the procedure.  ?For the next 24 hours, DO NOT: ?-Drive a car ?-Operate machinery ?-Drink alcoholic beverages ?-Take any medication unless instructed by your physician ?-Make any legal decisions or sign important papers. ? ?Meals: ?Start with liquid foods such as gelatin or soup. Progress to regular foods as tolerated. Avoid greasy, spicy, heavy foods. If nausea and/or vomiting occur, drink only clear liquids until the nausea and/or vomiting subsides. Call your physician if vomiting continues. ? ?Special Instructions/Symptoms: ?Your throat may feel dry or sore from the anesthesia or the breathing tube placed in your throat during surgery. If this causes discomfort, gargle with warm salt water. The discomfort should disappear within 24 hours. ? ?If you had a scopolamine patch placed behind your ear for the management of post- operative nausea and/or vomiting: ? ?1. The medication in the patch is effective for 72 hours, after which it should be removed.  Wrap patch in a tissue and discard in the trash. Wash hands thoroughly with soap and water. ?2. You may remove the patch earlier than 72 hours if you experience unpleasant side effects which may include dry mouth, dizziness or visual disturbances. ?3. Avoid touching the patch. Wash your hands with soap and water after contact with the patch. ? ? ?    ?  patch. °  ° ° °

## 2017-03-03 NOTE — Anesthesia Procedure Notes (Signed)
Procedure Name: Intubation Date/Time: 03/03/2017 9:04 AM Performed by: Melynda Ripple D Pre-anesthesia Checklist: Patient identified, Emergency Drugs available, Suction available and Patient being monitored Patient Re-evaluated:Patient Re-evaluated prior to inductionOxygen Delivery Method: Circle system utilized Preoxygenation: Pre-oxygenation with 100% oxygen Intubation Type: IV induction Ventilation: Mask ventilation without difficulty Laryngoscope Size: Mac and 3 Grade View: Grade I Tube type: Oral Tube size: 7.0 mm Number of attempts: 1 Airway Equipment and Method: Stylet and Oral airway Placement Confirmation: ETT inserted through vocal cords under direct vision,  positive ETCO2 and breath sounds checked- equal and bilateral Secured at: 22 cm Tube secured with: Tape Dental Injury: Teeth and Oropharynx as per pre-operative assessment

## 2017-03-03 NOTE — Interval H&P Note (Signed)
History and Physical Interval Note:  03/03/2017 8:27 AM  Elizabeth Harvey  has presented today for surgery, with the diagnosis of breast hypertrophy  The various methods of treatment have been discussed with the patient and family. After consideration of risks, benefits and other options for treatment, the patient has consented to  Procedure(s): MAMMARY REDUCTION  (BREAST) (Bilateral) as a surgical intervention .  The patient's history has been reviewed, patient examined, no change in status, stable for surgery.  I have reviewed the patient's chart and labs.  Questions were answered to the patient's satisfaction.     Sophiya Morello

## 2017-03-03 NOTE — Transfer of Care (Signed)
Immediate Anesthesia Transfer of Care Note  Patient: Elizabeth Harvey  Procedure(s) Performed: Procedure(s): BILATERAL MAMMARY REDUCTION  (BREAST) (Bilateral)  Patient Location: PACU  Anesthesia Type:General  Level of Consciousness: awake, alert  and drowsy  Airway & Oxygen Therapy: Patient Spontanous Breathing and Patient connected to face mask oxygen  Post-op Assessment: Report given to RN and Post -op Vital signs reviewed and stable  Post vital signs: Reviewed and stable  Last Vitals:  Vitals:   03/03/17 0806  BP: 128/79  Pulse: 80  Resp: 18  Temp: 36.9 C    Last Pain:  Vitals:   03/03/17 0806  TempSrc: Oral      Patients Stated Pain Goal: 0 (13/14/38 8875)  Complications: No apparent anesthesia complications

## 2017-03-03 NOTE — Anesthesia Postprocedure Evaluation (Signed)
Anesthesia Post Note  Patient: CORNEISHA ALVI  Procedure(s) Performed: Procedure(s) (LRB): BILATERAL MAMMARY REDUCTION  (BREAST) (Bilateral)  Patient location during evaluation: PACU Anesthesia Type: General Level of consciousness: awake and alert Pain management: pain level controlled Vital Signs Assessment: post-procedure vital signs reviewed and stable Respiratory status: spontaneous breathing, nonlabored ventilation, respiratory function stable and patient connected to nasal cannula oxygen Cardiovascular status: blood pressure returned to baseline and stable Postop Assessment: no signs of nausea or vomiting Anesthetic complications: no       Last Vitals:  Vitals:   03/03/17 1333 03/03/17 1411  BP: (!) 143/99 (!) 145/79  Pulse: 99 (!) 103  Resp: 12 16  Temp: 37 C 36.6 C    Last Pain:  Vitals:   03/03/17 1411  TempSrc: Oral  PainSc: 3                  Montez Hageman

## 2017-03-03 NOTE — Anesthesia Preprocedure Evaluation (Signed)
Anesthesia Evaluation  Patient identified by MRN, date of birth, ID band Patient awake    Reviewed: Allergy & Precautions, NPO status , Patient's Chart, lab work & pertinent test results  History of Anesthesia Complications (+) PONV  Airway Mallampati: II  TM Distance: >3 FB Neck ROM: Full    Dental no notable dental hx.    Pulmonary neg pulmonary ROS,    Pulmonary exam normal breath sounds clear to auscultation       Cardiovascular negative cardio ROS Normal cardiovascular exam Rhythm:Regular Rate:Normal     Neuro/Psych negative neurological ROS  negative psych ROS   GI/Hepatic Neg liver ROS, GERD  Controlled,  Endo/Other  negative endocrine ROS  Renal/GU negative Renal ROS  negative genitourinary   Musculoskeletal negative musculoskeletal ROS (+)   Abdominal   Peds negative pediatric ROS (+)  Hematology negative hematology ROS (+)   Anesthesia Other Findings   Reproductive/Obstetrics negative OB ROS                             Anesthesia Physical Anesthesia Plan  ASA: II  Anesthesia Plan: General   Post-op Pain Management:    Induction: Intravenous  Airway Management Planned: Oral ETT  Additional Equipment:   Intra-op Plan:   Post-operative Plan: Extubation in OR  Informed Consent: I have reviewed the patients History and Physical, chart, labs and discussed the procedure including the risks, benefits and alternatives for the proposed anesthesia with the patient or authorized representative who has indicated his/her understanding and acceptance.   Dental advisory given  Plan Discussed with: CRNA  Anesthesia Plan Comments:         Anesthesia Quick Evaluation

## 2017-03-03 NOTE — Op Note (Signed)
Operative Note   DATE OF OPERATION: 5.11.18  LOCATION: [facility]-outpatient  SURGICAL DIVISION: Plastic Surgery  PREOPERATIVE DIAGNOSES:  1. Macromastia 2. Chronic neck and back pain  POSTOPERATIVE DIAGNOSES:  same  PROCEDURE:  Bilateral breast reduction  SURGEON: Irene Limbo MD MBA  ASSISTANT: none  ANESTHESIA:  General.   EBL: 75 ml  COMPLICATIONS: None immediate.   INDICATIONS FOR PROCEDURE:  The patient, Elizabeth Harvey, is a 56 y.o. female born on [birth year], is here for breast reduction for chronic neck and back pain in setting of macromastia.   FINDINGS: Right reduction 373 g Left 289 g  DESCRIPTION OF PROCEDURE:  The patient was marked standing in the preoperative area to mark sternal notch, chest midline, anterior axillary lines, inframammary folds. The location of new nipple areolar complex was marked at level of on iframammary fold on anterior surface breast by palpation. This was marked symmetric over bilateral breasts. Medial and lateral displacement of breast used to mark vertical limbs. With aid of Wise pattern marker, location of new nipple areolar complex and vertical limbs (8 cm) were marked. The axillary masses were marked for excision.The patient was taken to the operating room. SCDs were placed and IV antibiotics were given. The patient's operative site was prepped and draped in a sterile fashion. A time out was performed and all information was confirmed to be correct.   Over left breast, superomedial pedicle marked and nipple areolar complex marked with 42 mm diameter marker. Pedicle deepithlialized and developed 5-6 cm thickness and dissected toward chest wall until tension free rotation of pedicle achieved. Inferior pole breast tissue resected. Medial and lateral flaps developed. Additional breast tissue excised in area of planned NAC inset.Breast tailor tacked closed.  I then directed attention to right breast where superomedial pedicle  designed. The pedicle was deepithelialized and developed to chest wall. Inferior pole breast tissue resected. Medial and lateral flaps developed. Additional tissue of superior pole and lateral breast excised. Skin and soft tissue flaps developed until able to be redraped over pedicle without tension. Right breast tailor tacked.  Patient brought to upright sitting position and assessed for symmetry. Patent returned to supine position. Breast cavities irrigated and hemostasis obtained. Exparel infiltrated throughout both breasts and axillary incisions. 15 Fr JP placed in each breast and secured with 2-0 nylon. Closure completed with 3-0 vicryl to approximate dermis along inframammary fold and vertical limb. NAC inset with 4-0 vicryl in dermis. Skin closure completed with 4-0 monocryl subcuticular throughout. Tissue adhesive applied.  The patient was allowed to wake from anesthesia, extubated and taken to the recovery room in satisfactory condition.   SPECIMENS: Right and left breast reduction  DRAINS: 15 Fr JP in right and left breast  Irene Limbo, MD [facility] [phone], pin [phone]

## 2017-03-06 ENCOUNTER — Encounter (HOSPITAL_BASED_OUTPATIENT_CLINIC_OR_DEPARTMENT_OTHER): Payer: Self-pay | Admitting: Plastic Surgery

## 2017-03-06 LAB — POCT HEMOGLOBIN-HEMACUE: Hemoglobin: 12.3 g/dL (ref 12.0–15.0)

## 2017-03-22 ENCOUNTER — Encounter: Payer: Self-pay | Admitting: Radiation Oncology

## 2017-03-23 NOTE — Progress Notes (Signed)
Location of Breast Cancer:  Histology per Pathology Report:   Receptor Status: ER(), PR (), Her2-neu (), Ki-()  Did patient present with symptoms (if so, please note symptoms) or was this found on screening mammography?:   Past/Anticipated interventions by surgeon, if PYP:PJKDTOIZT 03/03/2017: 1. Breast, Mammoplasty, Left - FIBROCYSTIC CHANGES. - THERE IS NO EVIDENCE OF MALIGNANCY. 2. Breast, Mammoplasty, Right - LOBULAR NEOPLASIA (ATYPICAL LOBULAR HYPERPLASIA).  Past/Anticipated interventions by medical oncology, if any: Chemotherapy   Lymphedema issues, if any:   Pain issues, if any:   SAFETY ISSUES:  Prior radiation?   Pacemaker/ICD?   Possible current pregnancy?  Is the patient on methotrexate?   Current Complaints / other details:     Rebecca Eaton, RN 03/23/2017,3:58 PM

## 2017-03-29 ENCOUNTER — Encounter: Payer: Self-pay | Admitting: Oncology

## 2017-03-29 ENCOUNTER — Telehealth: Payer: Self-pay | Admitting: Oncology

## 2017-03-29 NOTE — Telephone Encounter (Signed)
Appt has been scheduled for the pt to see Dr. Jana Hakim on 6/28 at 4pm. Pt aware to arrive 30 minutes early. Demographics verified. Letter mailed tot he pt.

## 2017-03-30 ENCOUNTER — Ambulatory Visit: Payer: BLUE CROSS/BLUE SHIELD | Admitting: Radiation Oncology

## 2017-03-30 ENCOUNTER — Ambulatory Visit: Payer: BLUE CROSS/BLUE SHIELD

## 2017-03-30 ENCOUNTER — Ambulatory Visit
Admission: RE | Admit: 2017-03-30 | Discharge: 2017-03-30 | Disposition: A | Discharge status: Home / Self Care | Payer: BLUE CROSS/BLUE SHIELD | Source: Ambulatory Visit | Attending: Radiation Oncology | Admitting: Radiation Oncology

## 2017-04-03 ENCOUNTER — Telehealth: Payer: Self-pay | Admitting: Oncology

## 2017-04-03 NOTE — Telephone Encounter (Signed)
Pt cld to reschedule appt w/Dr. Jana Hakim to 7/19.

## 2017-04-20 ENCOUNTER — Other Ambulatory Visit: Payer: BLUE CROSS/BLUE SHIELD

## 2017-04-20 ENCOUNTER — Encounter: Payer: BLUE CROSS/BLUE SHIELD | Admitting: Oncology

## 2017-05-01 ENCOUNTER — Other Ambulatory Visit: Payer: Self-pay | Admitting: Oncology

## 2017-05-10 ENCOUNTER — Other Ambulatory Visit: Payer: Self-pay | Admitting: Emergency Medicine

## 2017-05-10 DIAGNOSIS — Z1239 Encounter for other screening for malignant neoplasm of breast: Secondary | ICD-10-CM | POA: Insufficient documentation

## 2017-05-11 ENCOUNTER — Other Ambulatory Visit (HOSPITAL_BASED_OUTPATIENT_CLINIC_OR_DEPARTMENT_OTHER): Payer: BLUE CROSS/BLUE SHIELD

## 2017-05-11 ENCOUNTER — Ambulatory Visit (HOSPITAL_BASED_OUTPATIENT_CLINIC_OR_DEPARTMENT_OTHER): Payer: BLUE CROSS/BLUE SHIELD | Admitting: Oncology

## 2017-05-11 VITALS — BP 123/64 | HR 102 | Temp 97.9°F | Resp 20 | Ht 65.0 in | Wt 233.9 lb

## 2017-05-11 DIAGNOSIS — Z1239 Encounter for other screening for malignant neoplasm of breast: Secondary | ICD-10-CM

## 2017-05-11 DIAGNOSIS — N6091 Unspecified benign mammary dysplasia of right breast: Secondary | ICD-10-CM

## 2017-05-11 LAB — CBC WITH DIFFERENTIAL/PLATELET
BASO%: 0.8 % (ref 0.0–2.0)
BASOS ABS: 0 10*3/uL (ref 0.0–0.1)
EOS%: 0.4 % (ref 0.0–7.0)
Eosinophils Absolute: 0 10*3/uL (ref 0.0–0.5)
HEMATOCRIT: 42.9 % (ref 34.8–46.6)
HGB: 13.9 g/dL (ref 11.6–15.9)
LYMPH%: 35.3 % (ref 14.0–49.7)
MCH: 26.8 pg (ref 25.1–34.0)
MCHC: 32.4 g/dL (ref 31.5–36.0)
MCV: 82.9 fL (ref 79.5–101.0)
MONO#: 0.4 10*3/uL (ref 0.1–0.9)
MONO%: 8 % (ref 0.0–14.0)
NEUT#: 2.7 10*3/uL (ref 1.5–6.5)
NEUT%: 55.5 % (ref 38.4–76.8)
PLATELETS: 201 10*3/uL (ref 145–400)
RBC: 5.17 10*6/uL (ref 3.70–5.45)
RDW: 13.9 % (ref 11.2–14.5)
WBC: 4.8 10*3/uL (ref 3.9–10.3)
lymph#: 1.7 10*3/uL (ref 0.9–3.3)

## 2017-05-11 LAB — COMPREHENSIVE METABOLIC PANEL
ALT: 25 U/L (ref 0–55)
AST: 26 U/L (ref 5–34)
Albumin: 4 g/dL (ref 3.5–5.0)
Alkaline Phosphatase: 54 U/L (ref 40–150)
Anion Gap: 9 mEq/L (ref 3–11)
BUN: 12.7 mg/dL (ref 7.0–26.0)
CO2: 24 mEq/L (ref 22–29)
Calcium: 10.5 mg/dL — ABNORMAL HIGH (ref 8.4–10.4)
Chloride: 109 mEq/L (ref 98–109)
Creatinine: 0.9 mg/dL (ref 0.6–1.1)
EGFR: 68 mL/min/{1.73_m2} — ABNORMAL LOW (ref >90)
Glucose: 135 mg/dl (ref 70–140)
Potassium: 4.1 mEq/L (ref 3.5–5.1)
Sodium: 142 mEq/L (ref 136–145)
Total Bilirubin: 0.61 mg/dL (ref 0.20–1.20)
Total Protein: 7.2 g/dL (ref 6.4–8.3)

## 2017-05-11 NOTE — Progress Notes (Signed)
[facility]  [phone] [phone]     ID: [name] DOB: [birth year]  MR#: [id number]  HBZ#:[id number]  Patient Care Team: Harlan Stains, MD as PCP - General (Family Medicine) Chauncey Cruel, MD OTHER MD:  CHIEF COMPLAINT: Atypical lobular hyperplasia  CURRENT TREATMENT: Considering anti-estrogens   HISTORY OF PRESENT ILLNESS: Elizabeth Harvey was being evaluated for back pain by Dr. Rolena Infante. He suggested part of the problem might be her very generous breasts. The patient was evaluated for bilateral breast reduction under Dr. Iran Planas and she underwent bilateral mammoplasty 03/03/2017. The final pathology ([id number] found only fibrocystic changes in the left breast, but in the right breast there was an area of atypical lobular hyperplasia and the patient was referred to the high-risk clinic for consideration of her options and long-term follow-up.  Her subsequent history is as detailed below.  INTERVAL HISTORY: Elizabeth Harvey was evaluated in the high-risk clinic 05/11/2017  REVIEW OF SYSTEMS: She did well with the surgery, without significant problems with pain, bleeding, or fever. She still has a lower back pain. She also has some knee issues. Sometimes he feels anxious and depressed. She denies unusual headaches, history of seizures, strokes, any eye problems, nausea, vomiting, cough, phlegm production, pleurisy, or change in bowel or bladder habits. He told review of systems today was otherwise not contributory  PAST MEDICAL HISTORY: Past Medical History:  Diagnosis Date  . Anxiety   . GERD (gastroesophageal reflux disease)    occasional  . Mental disorder   . Plantar fasciitis of right foot    has orthotics for shoe  . PONV (postoperative nausea and vomiting) 20 yrs ago   had to be put all way to sleep     PAST SURGICAL HISTORY: Past Surgical History:  Procedure Laterality Date  . BREAST REDUCTION SURGERY Bilateral 03/03/2017   Procedure:  BILATERAL MAMMARY REDUCTION  (BREAST);  Surgeon: Irene Limbo, MD;  Location: [facility];  Service: Plastics;  Laterality: Bilateral;  . CESAREAN SECTION  20 yrs ago  . KNEE ARTHROSCOPY Bilateral 04/15/2013   Procedure: ARTHROSCOPY KNEE BILATERAL WITH DEBRIDEMENT;  Surgeon: Gearlean Alf, MD;  Location: [facility];  Service: Orthopedics;  Laterality: Bilateral;  Medial meniscal debridement and chondroplasty bilateral knees    FAMILY HISTORY Family History  Problem Relation Age of Onset  . Breast cancer Neg Hx   The patient's father is still living, at age 57. The patient's mother died from heart problems at age 57. The patient has one sister, 2 brothers. There is no history of breast or ovarian cancer in the family.  GYNECOLOGIC HISTORY:  No LMP recorded. Patient is postmenopausal. Menarche age 56, first live birth age 23. She is GX P2. She went through the change of life approximately age 56. She did not take hormone replacement.  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 Her husband Linna Hoff works in Engineer, technical sales for [employer]. Daughter Judson Roch graduated from [employer] with a summa, degree and teaches music in [place]. Son Harrington Challenger 19 is a Museum/gallery exhibitions officer at [employer]. The patient attends a local [employer] DIRECTIVES:    HEALTH MAINTENANCE: Social History  Substance Use Topics  . Smoking status: Never Smoker  . Smokeless tobacco: Never Used  . Alcohol use No     Colonoscopy: / Out loss  PAP:/ White  Bone density:/ "Normal", 2013?   No  Known Allergies  Current Outpatient Prescriptions  Medication Sig Dispense Refill  . acetaminophen (TYLENOL) 500 MG tablet Take 500 mg by mouth every 6 (six) hours as needed.    . DULoxetine (CYMBALTA) 60 MG capsule Take 60 mg by mouth daily.    Marland Kitchen esomeprazole (NEXIUM) 20 MG capsule Take 20 mg by mouth daily at 12 noon.    Marland Kitchen  LORazepam (ATIVAN) 1 MG tablet Take 1 mg by mouth every 6 (six) hours as needed for anxiety.    . naproxen sodium (ANAPROX) 550 MG tablet Take 550 mg by mouth 2 (two) times daily with a meal.    . tizanidine (ZANAFLEX) 2 MG capsule Take 2 mg by mouth 3 (three) times daily.    . cholecalciferol (VITAMIN D) 1000 UNITS tablet Take 1,000 Units by mouth daily.    . Cimetidine (TAGAMET PO) Take by mouth.    . fexofenadine (ALLEGRA) 180 MG tablet Take 180 mg by mouth as needed.     Marland Kitchen oxyCODONE (ROXICODONE) 5 MG immediate release tablet Take 1-2 tablets (5-10 mg total) by mouth every 4 (four) hours as needed. (Patient not taking: Reported on 05/11/2017) 30 tablet 0   No current facility-administered medications for this visit.     OBJECTIVE: Middle-aged white woman in no acute distress  Vitals:   05/11/17 1609  BP: 123/64  Pulse: (!) 102  Resp: 20  Temp: 97.9 F (36.6 C)     Body mass index is 38.92 kg/m.   Wt Readings from Last 3 Encounters:  05/11/17 233 lb 14.4 oz (106.1 kg)  03/03/17 233 lb (105.7 kg)  04/15/13 218 lb (98.9 kg)      ECOG FS:0 - Asymptomatic  Ocular: Sclerae unicteric, pupils round and equal Ear-nose-throat: Oropharynx clear and moist Lymphatic: No cervical or supraclavicular adenopathy Lungs no rales or rhonchi Heart regular rate and rhythm Abd soft, obese, nontender, positive bowel sounds MSK no focal spinal tenderness, no joint edema Neuro: non-focal, well-oriented, appropriate affect Breasts: Both breasts are status post reduction mammoplasty. The cosmetic result is good. There is good symmetry. There is no dehiscence, erythema, or swelling. Both axillae are benign.   LAB RESULTS:  CMP     Component Value Date/Time   NA 139 04/16/2013 0911   K 3.9 04/16/2013 0911   CL 106 04/16/2013 0911   CO2 26 04/16/2013 0911   GLUCOSE 179 (H) 04/16/2013 0911   BUN 7 04/16/2013 0911   CREATININE 0.59 04/16/2013 0911   CALCIUM 9.1 04/16/2013 0911   GFRNONAA >90  04/16/2013 0911   GFRAA >90 04/16/2013 0911    No results found for: TOTALPROTELP, ALBUMINELP, A1GS, A2GS, BETS, BETA2SER, GAMS, MSPIKE, SPEI  No results found for: Nils Pyle, [facility]  Lab Results  Component Value Date   WBC 4.8 05/11/2017   NEUTROABS 2.7 05/11/2017   HGB 13.9 05/11/2017   HCT 42.9 05/11/2017   MCV 82.9 05/11/2017   PLT 201 05/11/2017      Chemistry      Component Value Date/Time   NA 139 04/16/2013 0911   K 3.9 04/16/2013 0911   CL 106 04/16/2013 0911   CO2 26 04/16/2013 0911   BUN 7 04/16/2013 0911   CREATININE 0.59 04/16/2013 0911      Component Value Date/Time   CALCIUM 9.1 04/16/2013 0911       No results found for: LABCA2  No components found for: [id number]  No results for input(s): INR in the last 168 hours.  Urinalysis No  results found for: COLORURINE, APPEARANCEUR, LABSPEC, PHURINE, GLUCOSEU, HGBUR, BILIRUBINUR, KETONESUR, PROTEINUR, UROBILINOGEN, NITRITE, LEUKOCYTESUR   STUDIES: Bilateral screening [facility] 01/05/2017 found the breast density to be category B. There was no evidence of malignancy.  ELIGIBLE FOR AVAILABLE RESEARCH PROTOCOL: no  ASSESSMENT: 56 y.o. [place] woman status post bilateral mammoplasty May 11 28, with the right side showing atypical lobular hyperplasia.  (1) the patient is considering anti-estrogens  PLAN: We spent the better part of today's 50-minute appointment discussing the biology of breast cancer in general, and the specifics of atypical lobular hyperplasia [ALH] more specifically. Did be understands ALH means, first, relatively unrestricted growth of breast cells and, in addition, some morphologically different features from simple hyperplasia. Atypical lobular hyperplasia is not breast cancer but can be associated with invasive cancer and for that reason it needs to be removed.  Luckily in her case no invasive disease was noted. However ALH is also a marker  of breast cancer risk. The risk of developing breast cancer in patients with ALH falls somewhere between one half and 1% per year. The new cancer can develop in either breast. One half of those tumors would be invasive.  Working from the patient's age to the average survival of women today in the [place] I think Freda Munro can expect to live an additional 25-30 years. Using the high end of the risk spectrum for purposes of discussion, this would give her a 20-30% chance of developing breast cancer in her lifetime. This is approximately 3 times the normal risk.  Elizabeth Harvey has essentially two ways of lowering that risk. One of them is bilateral mastectomies. This is not recommended for this indication and I do not believe it would be covered by insurance even if she wanted it which she doesn't. The other, more reasonable approach would be to take anti-estrogens. This could be tamoxifen, raloxifene/Evista, or an aromatase inhibitor  We then discussed the possible toxicities, side effects and complications of the tamoxifen group as opposed to the aromatase inhibitors. All information was given to daily writing. Any of those agents if taken for 5 years would essentially cut the risk of developing a new breast cancer in half.  We also discussed intensified screening. This would be adding yearly MRI to yearly mammography with tomography. This approach greatly increases sensitivity. Any breast cancer that may develop should be found at the earliest possible stage. However intensified screening has not been documented to improve survival in this population. There are issues regarding cost even if insurance coverage is obtained. There also concerns regarding false positives especially in the first or second year of MRI screening. Recommendation is against yearly MRIs but in favor of yearly mammography with tomography.  Elizabeth Harvey has a good understanding of all these options. We agreed that yearly MRI and bilateral mastectomies  were excessive. With regards to anti-estrogens, she is leaning towards tamoxifen but really wants to discuss all this with her husband and do more reading. 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  If she decides to take one of anti-estrogens she will let us know and we'll place a prescription for her. Otherwise I will see her in April of next year. His means that if she has a breast exam through Dr. Dema Severin around October she will have a second one with me in April and of course she will have her mammography in March of  next year,  She knows to call for any other issues that may develop before that visit.   Chauncey Cruel, MD   05/11/2017 4:23 PM Medical Oncology and Hematology [facility] [address] [place], [place] [address] Tel. [phone]    Fax. [phone]

## 2017-08-14 ENCOUNTER — Other Ambulatory Visit (HOSPITAL_COMMUNITY)
Admission: RE | Admit: 2017-08-14 | Discharge: 2017-08-14 | Disposition: A | Discharge status: Home / Self Care | Payer: BLUE CROSS/BLUE SHIELD | Source: Ambulatory Visit | Attending: Family Medicine | Admitting: Family Medicine

## 2017-08-14 ENCOUNTER — Other Ambulatory Visit: Payer: Self-pay | Admitting: Physician Assistant

## 2017-08-14 DIAGNOSIS — F418 Other specified anxiety disorders: Secondary | ICD-10-CM | POA: Diagnosis not present

## 2017-08-14 DIAGNOSIS — E785 Hyperlipidemia, unspecified: Secondary | ICD-10-CM | POA: Diagnosis not present

## 2017-08-14 DIAGNOSIS — Z0000 Encounter for general adult medical examination without abnormal findings: Secondary | ICD-10-CM | POA: Diagnosis not present

## 2017-08-14 DIAGNOSIS — Z124 Encounter for screening for malignant neoplasm of cervix: Secondary | ICD-10-CM | POA: Diagnosis not present

## 2017-08-14 DIAGNOSIS — Z01419 Encounter for gynecological examination (general) (routine) without abnormal findings: Secondary | ICD-10-CM | POA: Insufficient documentation

## 2017-08-15 LAB — CYTOLOGY - PAP: DIAGNOSIS: NEGATIVE

## 2017-11-27 DIAGNOSIS — Z113 Encounter for screening for infections with a predominantly sexual mode of transmission: Secondary | ICD-10-CM | POA: Diagnosis not present

## 2017-11-27 DIAGNOSIS — N898 Other specified noninflammatory disorders of vagina: Secondary | ICD-10-CM | POA: Diagnosis not present

## 2017-11-27 DIAGNOSIS — R350 Frequency of micturition: Secondary | ICD-10-CM | POA: Diagnosis not present

## 2017-11-27 DIAGNOSIS — N952 Postmenopausal atrophic vaginitis: Secondary | ICD-10-CM | POA: Diagnosis not present

## 2017-11-30 ENCOUNTER — Other Ambulatory Visit: Payer: Self-pay | Admitting: Physician Assistant

## 2017-11-30 DIAGNOSIS — Z139 Encounter for screening, unspecified: Secondary | ICD-10-CM

## 2018-01-08 ENCOUNTER — Ambulatory Visit: Payer: BLUE CROSS/BLUE SHIELD

## 2018-02-07 NOTE — Progress Notes (Signed)
No show

## 2018-02-12 ENCOUNTER — Encounter: Payer: Self-pay | Admitting: Oncology

## 2018-02-12 ENCOUNTER — Ambulatory Visit (HOSPITAL_BASED_OUTPATIENT_CLINIC_OR_DEPARTMENT_OTHER): Payer: BLUE CROSS/BLUE SHIELD | Admitting: Oncology

## 2018-02-12 DIAGNOSIS — Z1231 Encounter for screening mammogram for malignant neoplasm of breast: Secondary | ICD-10-CM

## 2018-02-12 DIAGNOSIS — N6091 Unspecified benign mammary dysplasia of right breast: Secondary | ICD-10-CM

## 2018-02-20 DIAGNOSIS — J309 Allergic rhinitis, unspecified: Secondary | ICD-10-CM | POA: Diagnosis not present

## 2018-02-20 DIAGNOSIS — M545 Low back pain: Secondary | ICD-10-CM | POA: Diagnosis not present

## 2018-02-20 DIAGNOSIS — A6004 Herpesviral vulvovaginitis: Secondary | ICD-10-CM | POA: Diagnosis not present

## 2018-02-20 DIAGNOSIS — F418 Other specified anxiety disorders: Secondary | ICD-10-CM | POA: Diagnosis not present

## 2018-06-05 ENCOUNTER — Ambulatory Visit: Payer: BLUE CROSS/BLUE SHIELD

## 2018-06-20 ENCOUNTER — Ambulatory Visit
Admission: RE | Admit: 2018-06-20 | Discharge: 2018-06-20 | Disposition: A | Discharge status: Home / Self Care | Payer: BLUE CROSS/BLUE SHIELD | Source: Ambulatory Visit | Attending: Physician Assistant | Admitting: Physician Assistant

## 2018-06-20 DIAGNOSIS — Z1231 Encounter for screening mammogram for malignant neoplasm of breast: Secondary | ICD-10-CM | POA: Diagnosis not present

## 2018-06-20 DIAGNOSIS — Z139 Encounter for screening, unspecified: Secondary | ICD-10-CM

## 2018-07-27 DIAGNOSIS — Z1211 Encounter for screening for malignant neoplasm of colon: Secondary | ICD-10-CM | POA: Diagnosis not present

## 2018-07-27 DIAGNOSIS — K649 Unspecified hemorrhoids: Secondary | ICD-10-CM | POA: Diagnosis not present

## 2018-07-27 DIAGNOSIS — Z8371 Family history of colonic polyps: Secondary | ICD-10-CM | POA: Diagnosis not present

## 2018-07-27 DIAGNOSIS — K635 Polyp of colon: Secondary | ICD-10-CM | POA: Diagnosis not present

## 2018-07-31 DIAGNOSIS — Z1211 Encounter for screening for malignant neoplasm of colon: Secondary | ICD-10-CM | POA: Diagnosis not present

## 2018-07-31 DIAGNOSIS — K635 Polyp of colon: Secondary | ICD-10-CM | POA: Diagnosis not present

## 2018-11-14 DIAGNOSIS — E669 Obesity, unspecified: Secondary | ICD-10-CM | POA: Diagnosis not present

## 2018-11-14 DIAGNOSIS — F418 Other specified anxiety disorders: Secondary | ICD-10-CM | POA: Diagnosis not present

## 2018-11-14 DIAGNOSIS — Z0000 Encounter for general adult medical examination without abnormal findings: Secondary | ICD-10-CM | POA: Diagnosis not present

## 2018-11-14 DIAGNOSIS — E785 Hyperlipidemia, unspecified: Secondary | ICD-10-CM | POA: Diagnosis not present

## 2019-03-01 DIAGNOSIS — F418 Other specified anxiety disorders: Secondary | ICD-10-CM | POA: Diagnosis not present

## 2019-03-05 DIAGNOSIS — H43813 Vitreous degeneration, bilateral: Secondary | ICD-10-CM | POA: Diagnosis not present

## 2019-03-07 ENCOUNTER — Other Ambulatory Visit: Payer: Self-pay

## 2019-03-07 ENCOUNTER — Ambulatory Visit (INDEPENDENT_AMBULATORY_CARE_PROVIDER_SITE_OTHER): Payer: BLUE CROSS/BLUE SHIELD | Admitting: Family Medicine

## 2019-03-07 DIAGNOSIS — Z713 Dietary counseling and surveillance: Secondary | ICD-10-CM

## 2019-03-07 NOTE — Progress Notes (Signed)
Telehealth Encounter I connected with Elizabeth Harvey (MRN [id number]) on 03/07/2019 by video-enabled, HIPAA-compliant MyChart telemedicine application, verified that I am speaking with the correct person using two identifiers, and that she was in a private environment conducive to confidentiality.  I discussed the limitations of evaluation and management by telemedicine. The patient expressed understanding and agreed to proceed.  Provider was Kennith Center, PhD, RD, LDN, CEDRD Provider(s) located at home during this telehealth encounter; patient was at home  Appt start time: 1330 end time: 1430 (1 hour)  Reason for telehealth visit: Follow-up of Medical Nutrition Therapy for obesity (E66.9)   Relevant history/background: Elizabeth Harvey was referred by Maude Leriche, PA at [place] [facility] for weight management.  Elizabeth Harvey would like to lower her LDL also. 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    Assessment: Usual eating pattern: 3 meals and 1-2 snacks per day. Frequent foods and beverages: 4 c coffee with 1 pkt sweetener, 24 oz diet soda, 24 oz unsweet tea, 24 oz water; cereal & 2% milk, chx, sandwiches, ground beef, fruit, vegetables.  Avoided foods: scallops (disliked).   Usual physical activity: yard work 2-3 X wk (for hours at a time). Sleep: Increased duloxetine (cymbalta) to 90 mg recently to help her mood, and has been sleeping great since then.    24-hr recall: (Up at 9 AM) (8:30 AM)-   1 c coffee, 1 sweetener B (9:20 AM)-  1 c coffee, 1 sweetener, 1 1/2 c Honey Nut Cheerios, 1 1/2 c 2% milk Snk ( AM)-   --- L (1 PM)-  3 small beef&cheese tacos, 12 oz diet soda  Snk (4 PM)-  1 small taco D (7 PM)-  3 Swedish meatballs, 1 c buttered pasta, unsweet tea Snk ( PM)-  7 malted milk balls, 1 c decaf, 1 sweetener Typical day? Yes, but ate less than usual for dinner yesterday.    Intervention: Reviewed diet history and made recommendations  for better management of appetite.  Encouraged greater mindfulness and use of Food Decisions Algorithm to help in this process, as well as advance planning.    See Patient Instructions for specific behavioral goals.    Follow-up: MyChart video visit in two weeks, May 28 at 1 PM.   Elizabeth Harvey

## 2019-03-07 NOTE — Patient Instructions (Addendum)
Goals:  1. Eat at least 3 REAL meals and 1-2 snacks per day.  Aim for no more than 5 hours between eating.  Eat breakfast within one hour of getting up.  A REAL meal includes at least some protein, some starch, and vegetables and/or fruit.   OR:  Would you serve this to a guest in your home, and call it a meal.  Always plate your food!  Smaller plates, bowls, glasses, and utensils help [place] to feel just as satisfied with less food.   2. Set aside at least a few minutes weekly to plan at least some of the week's meals.  Use the Meal Planning form emailed to you to design some meals that are relatively quick and easy; this can serve as your default of back-up meal plan.   3. Use the food decisions algorithm as needed when feeling down and/or struggling with a food decision.  Keep in mind that food satisfaction is crucial to managing appetite.  The more mindful you are in planning meals and snacks and in eating, the greater satisfaction you are likely to have.    Follow-up: MyChart video visit in two weeks, May 28 at 1:30 PM.

## 2019-03-21 ENCOUNTER — Other Ambulatory Visit: Payer: Self-pay

## 2019-03-21 ENCOUNTER — Ambulatory Visit (INDEPENDENT_AMBULATORY_CARE_PROVIDER_SITE_OTHER): Payer: BLUE CROSS/BLUE SHIELD | Admitting: Family Medicine

## 2019-03-21 DIAGNOSIS — E669 Obesity, unspecified: Secondary | ICD-10-CM | POA: Diagnosis not present

## 2019-03-21 NOTE — Progress Notes (Signed)
Telehealth Encounter I connected with LILIAN FUHS (MRN [id number]) on 03/21/2019 by video-enabled, HIPAA-compliant MyChart telemedicine application, verified that I am speaking with the correct person using two identifiers, and that she was in a private environment conducive to confidentiality.  I discussed the limitations of evaluation and management by telemedicine. The patient expressed understanding and agreed to proceed.  Provider was Kennith Center, PhD, RD, LDN, CEDRD Provider(s) located at [facility] during this telehealth encounter; patient was at home\  Appt start time: 1500 end time: 1600 (1 hour)  Reason for telehealth visit: Follow-up of Medical Nutrition Therapy for obesity (E66.9; BMI 35-39.9)   Relevant history/background: Ms. Silvestro was referred by Maude Leriche, PA at [place] [facility] for weight management.  [name] would like to lower her LDL also. 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    Assessment: [name] said she has been more mindful the last couple of weeks, although she said she hasn't felt motivated, suffering from continued depression.  She has felt paralyzed with respect to all she has to do, feeling daunted by it all.  Debbie did complete her meal planning form, but hasn't yet used any of the meals on her list.  She also has used the Food Decisions algorithm, which she has found helpful the few times she has used it.   Recent eating pattern: 3 meals and 1-2 snacks per day, although all meals do not conform to "real" meals. Recent physical activity: yard work 2-3 X wk (for hours at a time). Sleep: still sleeping quite well.    Did not do a 24-hr recall today.   Intervention: We talked about a time when Debbie had felt comfortable with her food choices and was more active.  Debbie identified social pressure as the main driver of her motivation then.  This led to discussion of her tendency to  be externally motivated, and I recommended she explore Elzie Rings Rubin's The Four Tendencies, which may help her determine ways in which she can set up her environment and support systems to better help her achieve stated goals.  See Patient Instructions for specific behavioral goals and recommendations.    Follow-up: MyChart video visit in two weeks.   Shayne Deerman,JEANNIE

## 2019-03-21 NOTE — Patient Instructions (Addendum)
Recommendations: - Make a to-do list each morning with your first cup of coffee.   - Take the Four Tendencies quiz online at SwimmingTub.com.br.  Buy the book The Four Tendencies, and read about your type.  Then write up what your tendency means in terms of what will help you be successful.  Email that to [facility] no later than June 4th.   - Use the Food Decisions algorithm as needed.  Keep it in the kitchen!  Specific Goals:  1. Set aside at least a few minutes each Sunday afternoon to plan meals for the upcoming week.  If something gets in the way, make an alternative time if necessary.  You want to determine what foods you need to buy, where you're shopping, and when you'll go.  2. Eat at least 3 REAL meals and 1-2 snacks per day.  Aim for no more than 5 hours between eating.  A REAL meal includes at least some protein, some starch, and vegetables and/or fruit.   3. Walk at least 15 minutes 3 times per week.  Remember the 5-minute rule for exercise.   Complete your Goals Sheet each week.    Follow-up: MyChart video visit in two weeks, Monday, June 8 at 1:30 PM.

## 2019-04-01 ENCOUNTER — Telehealth: Payer: BLUE CROSS/BLUE SHIELD | Admitting: Family Medicine

## 2019-04-04 ENCOUNTER — Telehealth: Payer: BLUE CROSS/BLUE SHIELD | Admitting: Family Medicine

## 2019-04-09 ENCOUNTER — Other Ambulatory Visit: Payer: Self-pay

## 2019-04-09 ENCOUNTER — Ambulatory Visit (INDEPENDENT_AMBULATORY_CARE_PROVIDER_SITE_OTHER): Payer: BC Managed Care – PPO | Admitting: Family Medicine

## 2019-04-09 DIAGNOSIS — Z713 Dietary counseling and surveillance: Secondary | ICD-10-CM | POA: Diagnosis not present

## 2019-04-09 NOTE — Progress Notes (Signed)
PCP Maude Leriche, PA, [place] [facility] [place] Encounter I connected with Elizabeth Harvey (MRN [id number]) on 04/09/2019 by video-enabled, HIPAA-compliant MyChart telemedicine application, verified that I am speaking with the correct person using two identifiers, and that she was in a private environment conducive to confidentiality.  I discussed the limitations of evaluation and management by telemedicine. The patient expressed understanding and agreed to proceed.  Provider was Kennith Center, PhD, RD, LDN, CEDRD Provider(s) located at [facility] during this telehealth encounter; patient was at home\  Appt start time: 1430 end time: 1530 (1 hour)  Reason for telehealth visit: Follow-up of Medical Nutrition Therapy for obesity (E66.9; BMI 35-39.9).  NOTE: Preventive care [phone]) for Z71.3, Dietary Counseling & Surveillance.    Relevant history/background: Elizabeth Harvey was referred by Maude Leriche, PA at [place] [facility] for weight management.  Elizabeth Harvey would like to lower her LDL also. 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  Elizabeth Harvey felt it was accurate, and in reading about Obligers, recognized obstacles she faces as well as some possible solutions.  Significantly, Elizabeth Harvey said she doesn't even know what it means to "take care of herself."  She said she feels overwhelmed right now, knowing that she has so much "work to do."   Recent eating pattern: 2-3 meals and 1-2 snacks per day, although all meals do not conform to "real" meals. Recent physical activity: yard work 2-3 X wk (for hours at a time). Sleep: Sleeping well.    No Dietary Recall today.   Lab results from 11/14/18: TC 277; TG 133; LDL 193; HDL 58.  Other labs WNL.  Intervention: We reviewed  Elizabeth Harvey's synopsis of what she learned re. Obliger tendency, and ways she can use that understanding to her benefit.  We re-examined her goals previously set, and revised See Patient Instructions for specific behavioral goals and recommendations.    Follow-up: MyChart video visit in two weeks.   SYKES,JEANNIE

## 2019-04-09 NOTE — Patient Instructions (Addendum)
-   During this stressful time, it is important to give yourself grace.   - By putting off something that brings you joy, you are likely to practice some other behavior to make up for that lost pleasure, e.g., eating. - Keep in mind our conversation about Focused and Default ("narrative circuitry") mode of thinking.  You will be happier and healthier if you can spend more time in Focused thinking.   - Your trip to the beach: Aim for balanced meals, e.g., protein, starch, veg's and/or fruit.    Revised Goals:  1. Eat at least 3 REAL meals and 1-2 snacks per day.  Aim for no more than 5 hours between eating.  A REAL meal includes at least some protein, some starch, and vegetables and/or fruit.   2. Walk at least 15 minutes 3 times per week.  Remember the 5-minute rule for exercise.   Complete your Goals Sheet each week.  Bring goals sheet to the beach with you.

## 2019-05-02 ENCOUNTER — Telehealth: Payer: BC Managed Care – PPO | Admitting: Family Medicine

## 2019-07-19 DIAGNOSIS — M25552 Pain in left hip: Secondary | ICD-10-CM | POA: Diagnosis not present

## 2019-07-30 DIAGNOSIS — M25552 Pain in left hip: Secondary | ICD-10-CM | POA: Diagnosis not present

## 2019-08-23 ENCOUNTER — Other Ambulatory Visit: Payer: Self-pay | Admitting: Physician Assistant

## 2019-08-23 DIAGNOSIS — Z1231 Encounter for screening mammogram for malignant neoplasm of breast: Secondary | ICD-10-CM

## 2019-08-27 DIAGNOSIS — B372 Candidiasis of skin and nail: Secondary | ICD-10-CM | POA: Diagnosis not present

## 2019-10-15 ENCOUNTER — Ambulatory Visit: Payer: BC Managed Care – PPO

## 2019-11-18 DIAGNOSIS — M47896 Other spondylosis, lumbar region: Secondary | ICD-10-CM | POA: Diagnosis not present

## 2019-11-18 DIAGNOSIS — M25552 Pain in left hip: Secondary | ICD-10-CM | POA: Diagnosis not present

## 2019-11-18 DIAGNOSIS — M545 Low back pain: Secondary | ICD-10-CM | POA: Diagnosis not present

## 2019-11-25 DIAGNOSIS — M5416 Radiculopathy, lumbar region: Secondary | ICD-10-CM | POA: Diagnosis not present

## 2019-11-25 DIAGNOSIS — M25552 Pain in left hip: Secondary | ICD-10-CM | POA: Diagnosis not present

## 2019-11-29 DIAGNOSIS — M25552 Pain in left hip: Secondary | ICD-10-CM | POA: Diagnosis not present

## 2019-11-29 DIAGNOSIS — M545 Low back pain: Secondary | ICD-10-CM | POA: Diagnosis not present

## 2019-12-06 DIAGNOSIS — M25552 Pain in left hip: Secondary | ICD-10-CM | POA: Diagnosis not present

## 2019-12-25 ENCOUNTER — Other Ambulatory Visit: Payer: Self-pay

## 2019-12-25 ENCOUNTER — Ambulatory Visit
Admission: RE | Admit: 2019-12-25 | Discharge: 2019-12-25 | Disposition: A | Discharge status: Home / Self Care | Payer: BC Managed Care – PPO | Source: Ambulatory Visit | Attending: Physician Assistant | Admitting: Physician Assistant

## 2019-12-25 DIAGNOSIS — Z1231 Encounter for screening mammogram for malignant neoplasm of breast: Secondary | ICD-10-CM | POA: Diagnosis not present

## 2019-12-27 ENCOUNTER — Other Ambulatory Visit (HOSPITAL_COMMUNITY): Payer: BC Managed Care – PPO

## 2019-12-30 NOTE — Patient Instructions (Addendum)
DUE TO COVID-19 ONLY ONE VISITOR IS ALLOWED TO COME WITH YOU AND STAY IN THE WAITING ROOM ONLY DURING PRE OP AND PROCEDURE DAY OF SURGERY. THE 1 VISITOR MAY VISIT WITH YOU AFTER SURGERY IN YOUR PRIVATE ROOM DURING VISITING HOURS ONLY!  YOU NEED TO HAVE A COVID 19 TEST ON__Thursday 03/11/2021_____ @__11 :15am_____, THIS TEST MUST BE DONE BEFORE SURGERY, COME  [place] , [address].  ([place]) ONCE YOUR COVID TEST IS COMPLETED, PLEASE BEGIN THE QUARANTINE INSTRUCTIONS AS OUTLINED IN YOUR HANDOUT.                Elizabeth Harvey     Your procedure is scheduled on: Monday 01/06/2020   Report to [facility] Main  Entrance    Report to admitting at 1250  AM     Call this number if you have problems the morning of surgery [phone]    Remember: Do not eat food  :After Midnight.    NO SOLID FOOD AFTER MIDNIGHT THE NIGHT PRIOR TO SURGERY. NOTHING BY MOUTH EXCEPT CLEAR LIQUIDS UNTIL  1150 am.     PLEASE FINISH ENSURE DRINK PER SURGEON ORDER  WHICH NEEDS TO BE COMPLETED AT  1150 am .   CLEAR LIQUID DIET   Foods Allowed                                                                     Foods Excluded  Coffee and tea, regular and decaf                             liquids that you cannot  Plain Jell-O any favor except red or purple                                           see through such as: Fruit ices (not with fruit pulp)                                     milk, soups, orange juice  Iced Popsicles                                    All solid food Carbonated beverages, regular and diet                                    Cranberry, grape and apple juices Sports drinks like Gatorade Lightly seasoned clear broth or consume(fat free) Sugar, honey syrup  Sample Menu Breakfast                                Lunch  Supper Cranberry juice                    Beef broth                            Chicken  broth Jell-O                                     Grape juice                           Apple juice Coffee or tea                        Jell-O                                      Popsicle                                                Coffee or tea                        Coffee or tea  _____________________________________________________________________      BRUSH YOUR TEETH MORNING OF SURGERY AND RINSE YOUR MOUTH OUT, NO CHEWING GUM CANDY OR MINTS.     Take these medicines the morning of surgery with A SIP OF WATER: none                                 You may not have any metal on your body including hair pins and              piercings  Do not wear jewelry, make-up, lotions, powders or perfumes, deodorant             Do not wear nail polish on your fingernails.  Do not shave  48 hours prior to surgery.                 Do not bring valuables to the hospital. [name].  Contacts, dentures or bridgework may not be worn into surgery.  Leave suitcase in the car. After surgery it may be brought to your room.     Patients discharged the day of surgery will not be allowed to drive home. IF YOU ARE HAVING SURGERY AND GOING HOME THE SAME DAY, YOU MUST HAVE AN ADULT TO DRIVE YOU HOME AND  BE WITH YOU FOR 24 HOURS. YOU MAY GO HOME BY TAXI OR UBER OR ORTHERWISE, BUT AN ADULT MUST ACCOMPANY YOU HOME AND STAY WITH YOU FOR 24 HOURS.  Name and phone number of your driver:spouseLinna Harvey  [phone]                Please read over the following fact sheets you were given: _____________________________________________________________________             [facility] - Preparing for Surgery Before surgery, you  can play an important role.  Because skin is not sterile, your skin needs to be as free of germs as possible.  You can reduce the number of germs on your skin by washing with CHG (chlorahexidine gluconate) soap before surgery.  CHG is  an antiseptic cleaner which kills germs and bonds with the skin to continue killing germs even after washing. Please DO NOT use if you have an allergy to CHG or antibacterial soaps.  If your skin becomes reddened/irritated stop using the CHG and inform your nurse when you arrive at Short Stay. Do not shave (including legs and underarms) for at least 48 hours prior to the first CHG shower.  You may shave your face/neck. Please follow these instructions carefully:  1.  Shower with CHG Soap the night before surgery and the  morning of Surgery.  2.  If you choose to wash your hair, wash your hair first as usual with your  normal  shampoo.  3.  After you shampoo, rinse your hair and body thoroughly to remove the  shampoo.                           4.  Use CHG as you would any other liquid soap.  You can apply chg directly  to the skin and wash                       Gently with a scrungie or clean washcloth.  5.  Apply the CHG Soap to your body ONLY FROM THE NECK DOWN.   Do not use on face/ open                           Wound or open sores. Avoid contact with eyes, ears mouth and genitals (private parts).                       Wash face,  Genitals (private parts) with your normal soap.             6.  Wash thoroughly, paying special attention to the area where your surgery  will be performed.  7.  Thoroughly rinse your body with warm water from the neck down.  8.  DO NOT shower/wash with your normal soap after using and rinsing off  the CHG Soap.                9.  Pat yourself dry with a clean towel.            10.  Wear clean pajamas.            11.  Place clean sheets on your bed the night of your first shower and do not  sleep with pets. Day of Surgery : Do not apply any lotions/deodorants the morning of surgery.  Please wear clean clothes to the hospital/surgery center.  FAILURE TO FOLLOW THESE INSTRUCTIONS MAY RESULT IN THE CANCELLATION OF YOUR SURGERY PATIENT  SIGNATURE_________________________________  NURSE SIGNATURE__________________________________  ________________________________________________________________________   Elizabeth Harvey  An incentive spirometer is a tool that can help keep your lungs clear and active. This tool measures how well you are filling your lungs with each breath. Taking long deep breaths may help reverse or decrease the chance of developing breathing (pulmonary) problems (especially infection) following:  A long period of time when you are unable to move or be  active. BEFORE THE PROCEDURE   If the spirometer includes an indicator to show your best effort, your nurse or respiratory therapist will set it to a desired goal.  If possible, sit up straight or lean slightly forward. Try not to slouch.  Hold the incentive spirometer in an upright position. INSTRUCTIONS FOR USE  1. Sit on the edge of your bed if possible, or sit up as far as you can in bed or on a chair. 2. Hold the incentive spirometer in an upright position. 3. Breathe out normally. 4. Place the mouthpiece in your mouth and seal your lips tightly around it. 5. Breathe in slowly and as deeply as possible, raising the piston or the ball toward the top of the column. 6. Hold your breath for 3-5 seconds or for as long as possible. Allow the piston or ball to fall to the bottom of the column. 7. Remove the mouthpiece from your mouth and breathe out normally. 8. Rest for a few seconds and repeat Steps 1 through 7 at least 10 times every 1-2 hours when you are awake. Take your time and take a few normal breaths between deep breaths. 9. The spirometer may include an indicator to show your best effort. Use the indicator as a goal to work toward during each repetition. 10. After each set of 10 deep breaths, practice coughing to be sure your lungs are clear. If you have an incision (the cut made at the time of surgery), support your incision when coughing  by placing a pillow or rolled up towels firmly against it. Once you are able to get out of bed, walk around indoors and cough well. You may stop using the incentive spirometer when instructed by your caregiver.  RISKS AND COMPLICATIONS  Take your time so you do not get dizzy or light-headed.  If you are in pain, you may need to take or ask for pain medication before doing incentive spirometry. It is harder to take a deep breath if you are having pain. AFTER USE  Rest and breathe slowly and easily.  It can be helpful to keep track of a log of your progress. Your caregiver can provide you with a simple table to help with this. If you are using the spirometer at home, follow these instructions: [place] IF:   You are having difficultly using the spirometer.  You have trouble using the spirometer as often as instructed.  Your pain medication is not giving enough relief while using the spirometer.  You develop fever of 100.5 F (38.1 C) or higher. SEEK IMMEDIATE MEDICAL CARE IF:   You cough up bloody sputum that had not been present before.  You develop fever of 102 F (38.9 C) or greater.  You develop worsening pain at or near the incision site. MAKE SURE YOU:   Understand these instructions.  Will watch your condition.  Will get help right away if you are not doing well or get worse. Document Released: 02/20/2007 Document Revised: 01/02/2012 Document Reviewed: 04/23/2007 ExitCare Patient Information 2014 ExitCare, [place].   ________________________________________________________________________  WHAT IS A BLOOD TRANSFUSION? Blood Transfusion Information  A transfusion is the replacement of blood or some of its parts. Blood is made up of multiple cells which provide different functions.  Red blood cells carry oxygen and are used for blood loss replacement.  White blood cells fight against infection.  Platelets control bleeding.  Plasma helps clot  blood.  Other blood products are available for specialized needs,  such as hemophilia or other clotting disorders. BEFORE THE TRANSFUSION  Who gives blood for transfusions?   Healthy volunteers who are fully evaluated to make sure their blood is safe. This is blood bank blood. Transfusion therapy is the safest it has ever been in the practice of medicine. Before blood is taken from a donor, a complete history is taken to make sure that person has no history of diseases nor engages in risky social behavior (examples are intravenous drug use or sexual activity with multiple partners). The donor's travel history is screened to minimize risk of transmitting infections, such as malaria. The donated blood is tested for signs of infectious diseases, such as HIV and hepatitis. The blood is then tested to be sure it is compatible with you in order to minimize the chance of a transfusion reaction. If you or a relative donates blood, this is often done in anticipation of surgery and is not appropriate for emergency situations. It takes many days to process the donated blood. RISKS AND COMPLICATIONS Although transfusion therapy is very safe and saves many lives, the main dangers of transfusion include:   Getting an infectious disease.  Developing a transfusion reaction. This is an allergic reaction to something in the blood you were given. Every precaution is taken to prevent this. The decision to have a blood transfusion has been considered carefully by your caregiver before blood is given. Blood is not given unless the benefits outweigh the risks. AFTER THE TRANSFUSION  Right after receiving a blood transfusion, you will usually feel much better and more energetic. This is especially true if your red blood cells have gotten low (anemic). The transfusion raises the level of the red blood cells which carry oxygen, and this usually causes an energy increase.  The nurse administering the transfusion will monitor  you carefully for complications. HOME CARE INSTRUCTIONS  No special instructions are needed after a transfusion. You may find your energy is better. Speak with your caregiver about any limitations on activity for underlying diseases you may have. SEEK MEDICAL CARE IF:   Your condition is not improving after your transfusion.  You develop redness or irritation at the intravenous (IV) site. SEEK IMMEDIATE MEDICAL CARE IF:  Any of the following symptoms occur over the next 12 hours:  Shaking chills.  You have a temperature by mouth above 102 F (38.9 C), not controlled by medicine.  Chest, back, or muscle pain.  People around you feel you are not acting correctly or are confused.  Shortness of breath or difficulty breathing.  Dizziness and fainting.  You get a rash or develop hives.  You have a decrease in urine output.  Your urine turns a dark color or changes to pink, red, or brown. Any of the following symptoms occur over the next 10 days:  You have a temperature by mouth above 102 F (38.9 C), not controlled by medicine.  Shortness of breath.  Weakness after normal activity.  The white part of the eye turns yellow (jaundice).  You have a decrease in the amount of urine or are urinating less often.  Your urine turns a dark color or changes to pink, red, or brown. Document Released: 10/07/2000 Document Revised: 01/02/2012 Document Reviewed: 05/26/2008 [employer] Patient Information 2014 [place], [place].  _______________________________________________________________________

## 2019-12-31 ENCOUNTER — Other Ambulatory Visit: Payer: Self-pay

## 2019-12-31 ENCOUNTER — Encounter (HOSPITAL_COMMUNITY): Payer: Self-pay

## 2019-12-31 ENCOUNTER — Encounter (HOSPITAL_COMMUNITY)
Admission: RE | Admit: 2019-12-31 | Discharge: 2019-12-31 | Disposition: A | Discharge status: Home / Self Care | Payer: BC Managed Care – PPO | Source: Ambulatory Visit | Attending: Orthopedic Surgery | Admitting: Orthopedic Surgery

## 2019-12-31 DIAGNOSIS — Z01812 Encounter for preprocedural laboratory examination: Secondary | ICD-10-CM | POA: Insufficient documentation

## 2019-12-31 LAB — CBC
HCT: 41.6 % (ref 36.0–46.0)
Hemoglobin: 13.4 g/dL (ref 12.0–15.0)
MCH: 28.2 pg (ref 26.0–34.0)
MCHC: 32.2 g/dL (ref 30.0–36.0)
MCV: 87.4 fL (ref 80.0–100.0)
Platelets: 176 10*3/uL (ref 150–400)
RBC: 4.76 MIL/uL (ref 3.87–5.11)
RDW: 12.6 % (ref 11.5–15.5)
WBC: 5.5 10*3/uL (ref 4.0–10.5)
nRBC: 0 % (ref 0.0–0.2)

## 2019-12-31 LAB — COMPREHENSIVE METABOLIC PANEL
ALT: 22 U/L (ref 0–44)
AST: 21 U/L (ref 15–41)
Albumin: 3.9 g/dL (ref 3.5–5.0)
Alkaline Phosphatase: 48 U/L (ref 38–126)
Anion gap: 9 (ref 5–15)
BUN: 16 mg/dL (ref 6–20)
CO2: 23 mmol/L (ref 22–32)
Calcium: 9.1 mg/dL (ref 8.9–10.3)
Chloride: 107 mmol/L (ref 98–111)
Creatinine, Ser: 0.68 mg/dL (ref 0.44–1.00)
GFR calc Af Amer: >60 mL/min (ref >60)
GFR calc non Af Amer: >60 mL/min (ref >60)
Glucose, Bld: 102 mg/dL — ABNORMAL HIGH (ref 70–99)
Potassium: 3.9 mmol/L (ref 3.5–5.1)
Sodium: 139 mmol/L (ref 135–145)
Total Bilirubin: 0.8 mg/dL (ref 0.3–1.2)
Total Protein: 6.7 g/dL (ref 6.5–8.1)

## 2019-12-31 LAB — APTT: aPTT: 33 seconds (ref 24–36)

## 2019-12-31 LAB — ABO/RH: ABO/RH(D): O POS

## 2019-12-31 LAB — PROTIME-INR
INR: 1 (ref 0.8–1.2)
Prothrombin Time: 13 seconds (ref 11.4–15.2)

## 2019-12-31 NOTE — Progress Notes (Signed)
PCP - Dr. Harlan Stains Cardiologist - n/a  Chest x-ray -n/a  EKG -n/a  Stress Test - n/a ECHO - n/a Cardiac Cath -n/a   Sleep Study - n/a CPAP - n/a  Fasting Blood Sugar - n/a Checks Blood Sugar __0___ times a day  Blood Thinner Instructions:n/a Aspirin Instructions:Takes Advil prn. Last Dose:12/30/2019 took Advil and will take no more.  Anesthesia review:   Patient denies shortness of breath, fever, cough and chest pain at PAT appointment   Patient verbalized understanding of instructions that were given to them at the PAT appointment. Patient was also instructed that they will need to review over the PAT instructions again at home before surgery.

## 2020-01-02 ENCOUNTER — Other Ambulatory Visit (HOSPITAL_COMMUNITY)
Admission: RE | Admit: 2020-01-02 | Discharge: 2020-01-02 | Disposition: A | Discharge status: Home / Self Care | Payer: BC Managed Care – PPO | Source: Ambulatory Visit | Attending: Orthopedic Surgery | Admitting: Orthopedic Surgery

## 2020-01-02 DIAGNOSIS — Z20822 Contact with and (suspected) exposure to covid-19: Secondary | ICD-10-CM | POA: Insufficient documentation

## 2020-01-02 DIAGNOSIS — Z01812 Encounter for preprocedural laboratory examination: Secondary | ICD-10-CM | POA: Diagnosis not present

## 2020-01-02 LAB — SARS CORONAVIRUS 2 (TAT 6-24 HRS): SARS Coronavirus 2: NEGATIVE

## 2020-01-06 ENCOUNTER — Ambulatory Visit (HOSPITAL_COMMUNITY)
Admission: RE | Admit: 2020-01-06 | Discharge: 2020-01-06 | Disposition: A | Discharge status: Home / Self Care | Payer: BC Managed Care – PPO | Attending: Orthopedic Surgery | Admitting: Orthopedic Surgery

## 2020-01-06 ENCOUNTER — Other Ambulatory Visit: Payer: Self-pay

## 2020-01-06 ENCOUNTER — Encounter (HOSPITAL_COMMUNITY): Payer: Self-pay | Admitting: Orthopedic Surgery

## 2020-01-06 ENCOUNTER — Encounter (HOSPITAL_COMMUNITY)
Admission: RE | Disposition: A | Discharge status: Home / Self Care | Payer: Self-pay | Source: Home / Self Care | Attending: Orthopedic Surgery

## 2020-01-06 ENCOUNTER — Ambulatory Visit (HOSPITAL_COMMUNITY): Payer: BC Managed Care – PPO | Admitting: Physician Assistant

## 2020-01-06 DIAGNOSIS — M7062 Trochanteric bursitis, left hip: Secondary | ICD-10-CM | POA: Diagnosis not present

## 2020-01-06 DIAGNOSIS — X58XXXA Exposure to other specified factors, initial encounter: Secondary | ICD-10-CM | POA: Diagnosis not present

## 2020-01-06 DIAGNOSIS — F419 Anxiety disorder, unspecified: Secondary | ICD-10-CM | POA: Diagnosis not present

## 2020-01-06 DIAGNOSIS — Z87891 Personal history of nicotine dependence: Secondary | ICD-10-CM | POA: Diagnosis not present

## 2020-01-06 DIAGNOSIS — Z79899 Other long term (current) drug therapy: Secondary | ICD-10-CM | POA: Diagnosis not present

## 2020-01-06 DIAGNOSIS — S76012A Strain of muscle, fascia and tendon of left hip, initial encounter: Secondary | ICD-10-CM | POA: Diagnosis not present

## 2020-01-06 DIAGNOSIS — M7072 Other bursitis of hip, left hip: Secondary | ICD-10-CM | POA: Diagnosis not present

## 2020-01-06 DIAGNOSIS — K219 Gastro-esophageal reflux disease without esophagitis: Secondary | ICD-10-CM | POA: Diagnosis not present

## 2020-01-06 DIAGNOSIS — S76312A Strain of muscle, fascia and tendon of the posterior muscle group at thigh level, left thigh, initial encounter: Secondary | ICD-10-CM | POA: Diagnosis not present

## 2020-01-06 HISTORY — PX: OPEN SURGICAL REPAIR OF GLUTEAL TENDON: SHX5995

## 2020-01-06 LAB — TYPE AND SCREEN
ABO/RH(D): O POS
Antibody Screen: NEGATIVE

## 2020-01-06 SURGERY — REPAIR, TENDON, GLUTEUS MEDIUS, OPEN
Anesthesia: General | Site: Hip | Laterality: Left

## 2020-01-06 MED ORDER — FENTANYL CITRATE (PF) 100 MCG/2ML IJ SOLN
25.0000 ug | INTRAMUSCULAR | Status: DC | PRN
  Administered 2020-01-06: 50 ug via INTRAVENOUS

## 2020-01-06 MED ORDER — METHOCARBAMOL 500 MG IVPB - SIMPLE MED
500.0000 mg | Freq: Four times a day (QID) | INTRAVENOUS | Status: DC | PRN
  Administered 2020-01-06: 500 mg via INTRAVENOUS
  Filled 2020-01-06: qty 500

## 2020-01-06 MED ORDER — ONDANSETRON HCL 4 MG PO TABS
4.0000 mg | ORAL_TABLET | Freq: Four times a day (QID) | ORAL | Status: DC | PRN
  Filled 2020-01-06: qty 1

## 2020-01-06 MED ORDER — SCOPOLAMINE 1 MG/3DAYS TD PT72
MEDICATED_PATCH | TRANSDERMAL | Status: AC
  Filled 2020-01-06: qty 1

## 2020-01-06 MED ORDER — 0.9 % SODIUM CHLORIDE (POUR BTL) OPTIME
TOPICAL | Status: DC | PRN
  Administered 2020-01-06: 1000 mL

## 2020-01-06 MED ORDER — BUPIVACAINE HCL (PF) 0.25 % IJ SOLN
INTRAMUSCULAR | Status: AC
  Filled 2020-01-06: qty 30

## 2020-01-06 MED ORDER — DEXAMETHASONE SODIUM PHOSPHATE 10 MG/ML IJ SOLN
INTRAMUSCULAR | Status: DC | PRN
  Administered 2020-01-06: 10 mg via INTRAVENOUS

## 2020-01-06 MED ORDER — CHLORHEXIDINE GLUCONATE 4 % EX LIQD
60.0000 mL | Freq: Once | CUTANEOUS | Status: AC
  Administered 2020-01-06: 4 via TOPICAL

## 2020-01-06 MED ORDER — ASPIRIN EC 325 MG PO TBEC: 325.0000 mg | DELAYED_RELEASE_TABLET | Freq: Every day | ORAL | 0 refills | Status: AC

## 2020-01-06 MED ORDER — DEXAMETHASONE SODIUM PHOSPHATE 10 MG/ML IJ SOLN
INTRAMUSCULAR | Status: AC
  Filled 2020-01-06: qty 1

## 2020-01-06 MED ORDER — FENTANYL CITRATE (PF) 250 MCG/5ML IJ SOLN
INTRAMUSCULAR | Status: AC
  Filled 2020-01-06: qty 5

## 2020-01-06 MED ORDER — BUPIVACAINE HCL (PF) 0.25 % IJ SOLN
INTRAMUSCULAR | Status: DC | PRN
  Administered 2020-01-06: 30 mL

## 2020-01-06 MED ORDER — METOCLOPRAMIDE HCL 5 MG/ML IJ SOLN
INTRAMUSCULAR | Status: AC
  Filled 2020-01-06: qty 2

## 2020-01-06 MED ORDER — ONDANSETRON HCL 4 MG/2ML IJ SOLN
INTRAMUSCULAR | Status: DC | PRN
  Administered 2020-01-06: 4 mg via INTRAVENOUS

## 2020-01-06 MED ORDER — ONDANSETRON HCL 4 MG/2ML IJ SOLN
INTRAMUSCULAR | Status: AC
  Filled 2020-01-06: qty 2

## 2020-01-06 MED ORDER — LIDOCAINE 2% (20 MG/ML) 5 ML SYRINGE
INTRAMUSCULAR | Status: DC | PRN
  Administered 2020-01-06: 60 mg via INTRAVENOUS

## 2020-01-06 MED ORDER — KETOROLAC TROMETHAMINE 30 MG/ML IJ SOLN
INTRAMUSCULAR | Status: DC | PRN
  Administered 2020-01-06: 30 mg via INTRAVENOUS

## 2020-01-06 MED ORDER — FENTANYL CITRATE (PF) 250 MCG/5ML IJ SOLN
INTRAMUSCULAR | Status: DC | PRN
  Administered 2020-01-06 (×3): 50 ug via INTRAVENOUS
  Administered 2020-01-06: 100 ug via INTRAVENOUS

## 2020-01-06 MED ORDER — ACETAMINOPHEN 10 MG/ML IV SOLN
1000.0000 mg | Freq: Four times a day (QID) | INTRAVENOUS | Status: DC
  Administered 2020-01-06: 1000 mg via INTRAVENOUS
  Filled 2020-01-06: qty 100

## 2020-01-06 MED ORDER — ONDANSETRON HCL 4 MG/2ML IJ SOLN: 4.0000 mg | Freq: Four times a day (QID) | INTRAMUSCULAR | Status: DC | PRN

## 2020-01-06 MED ORDER — SCOPOLAMINE 1 MG/3DAYS TD PT72
MEDICATED_PATCH | TRANSDERMAL | Status: DC | PRN
  Administered 2020-01-06: 1 via TRANSDERMAL

## 2020-01-06 MED ORDER — DEXAMETHASONE SODIUM PHOSPHATE 10 MG/ML IJ SOLN: 8.0000 mg | Freq: Once | INTRAMUSCULAR | Status: DC

## 2020-01-06 MED ORDER — SUCCINYLCHOLINE CHLORIDE 200 MG/10ML IV SOSY
PREFILLED_SYRINGE | INTRAVENOUS | Status: DC | PRN
  Administered 2020-01-06: 200 mg via INTRAVENOUS

## 2020-01-06 MED ORDER — METOCLOPRAMIDE HCL 5 MG PO TABS
5.0000 mg | ORAL_TABLET | Freq: Three times a day (TID) | ORAL | Status: DC | PRN
  Filled 2020-01-06: qty 2

## 2020-01-06 MED ORDER — METOCLOPRAMIDE HCL 5 MG/ML IJ SOLN
5.0000 mg | Freq: Three times a day (TID) | INTRAMUSCULAR | Status: DC | PRN
  Administered 2020-01-06: 10 mg via INTRAVENOUS

## 2020-01-06 MED ORDER — MIDAZOLAM HCL 2 MG/2ML IJ SOLN
INTRAMUSCULAR | Status: AC
  Filled 2020-01-06: qty 2

## 2020-01-06 MED ORDER — CEFAZOLIN SODIUM-DEXTROSE 2-4 GM/100ML-% IV SOLN
2.0000 g | INTRAVENOUS | Status: AC
  Administered 2020-01-06: 2 g via INTRAVENOUS
  Filled 2020-01-06: qty 100

## 2020-01-06 MED ORDER — PROPOFOL 10 MG/ML IV BOLUS
INTRAVENOUS | Status: DC | PRN
  Administered 2020-01-06: 150 mg via INTRAVENOUS

## 2020-01-06 MED ORDER — HYDROCODONE-ACETAMINOPHEN 5-325 MG PO TABS: 1.0000 | ORAL_TABLET | Freq: Four times a day (QID) | ORAL | 0 refills | Status: AC | PRN

## 2020-01-06 MED ORDER — HYDROCODONE-ACETAMINOPHEN 5-325 MG PO TABS
ORAL_TABLET | ORAL | Status: AC
  Administered 2020-01-06: 1 via ORAL
  Filled 2020-01-06: qty 1

## 2020-01-06 MED ORDER — HYDROCODONE-ACETAMINOPHEN 5-325 MG PO TABS: 1.0000 | ORAL_TABLET | Freq: Four times a day (QID) | ORAL | Status: DC | PRN

## 2020-01-06 MED ORDER — METHOCARBAMOL 500 MG PO TABS: 500.0000 mg | ORAL_TABLET | Freq: Four times a day (QID) | ORAL | 0 refills | Status: DC | PRN

## 2020-01-06 MED ORDER — SUCCINYLCHOLINE CHLORIDE 200 MG/10ML IV SOSY
PREFILLED_SYRINGE | INTRAVENOUS | Status: AC
  Filled 2020-01-06: qty 10

## 2020-01-06 MED ORDER — MIDAZOLAM HCL 2 MG/2ML IJ SOLN
INTRAMUSCULAR | Status: DC | PRN
  Administered 2020-01-06: 2 mg via INTRAVENOUS

## 2020-01-06 MED ORDER — ROCURONIUM BROMIDE 10 MG/ML (PF) SYRINGE
PREFILLED_SYRINGE | INTRAVENOUS | Status: DC | PRN
  Administered 2020-01-06: 10 mg via INTRAVENOUS

## 2020-01-06 MED ORDER — LACTATED RINGERS IV SOLN: INTRAVENOUS | Status: DC

## 2020-01-06 MED ORDER — METHOCARBAMOL 500 MG PO TABS: 500.0000 mg | ORAL_TABLET | Freq: Four times a day (QID) | ORAL | Status: DC | PRN

## 2020-01-06 MED ORDER — FENTANYL CITRATE (PF) 100 MCG/2ML IJ SOLN
INTRAMUSCULAR | Status: AC
  Administered 2020-01-06: 50 ug via INTRAVENOUS
  Filled 2020-01-06: qty 2

## 2020-01-06 MED ORDER — PROPOFOL 10 MG/ML IV BOLUS
INTRAVENOUS | Status: AC
  Filled 2020-01-06: qty 20

## 2020-01-06 MED ORDER — POVIDONE-IODINE 10 % EX SWAB
2.0000 "application " | Freq: Once | CUTANEOUS | Status: AC
  Administered 2020-01-06: 2 via TOPICAL

## 2020-01-06 SURGICAL SUPPLY — 48 items
ANCHOR SUPER QUICK (Anchor) ×3 IMPLANT
BAG ZIPLOCK 12X15 (MISCELLANEOUS) IMPLANT
BIT DRILL 2.8X128 (BIT) IMPLANT
BIT DRILL 2.8X128MM (BIT)
BLADE EXTENDED COATED 6.5IN (ELECTRODE) IMPLANT
CLOSURE WOUND 1/2 X4 (GAUZE/BANDAGES/DRESSINGS) ×1
COVER SURGICAL LIGHT HANDLE (MISCELLANEOUS) ×3 IMPLANT
COVER WAND RF STERILE (DRAPES) IMPLANT
DRAPE INCISE IOBAN 66X45 STRL (DRAPES) ×3 IMPLANT
DRAPE ORTHO SPLIT 77X108 STRL (DRAPES) ×6
DRAPE POUCH INSTRU U-SHP 10X18 (DRAPES) ×3 IMPLANT
DRAPE SURG ORHT 6 SPLT 77X108 (DRAPES) ×2 IMPLANT
DRAPE U-SHAPE 47X51 STRL (DRAPES) ×3 IMPLANT
DRSG ADAPTIC 3X8 NADH LF (GAUZE/BANDAGES/DRESSINGS) ×3 IMPLANT
DRSG AQUACEL AG ADV 3.5X 6 (GAUZE/BANDAGES/DRESSINGS) ×3 IMPLANT
DRSG MEPILEX BORDER 4X8 (GAUZE/BANDAGES/DRESSINGS) ×3 IMPLANT
DURAPREP 26ML APPLICATOR (WOUND CARE) ×3 IMPLANT
ELECT REM PT RETURN 15FT ADLT (MISCELLANEOUS) ×3 IMPLANT
EVACUATOR 1/8 PVC DRAIN (DRAIN) IMPLANT
FACESHIELD WRAPAROUND (MASK) ×6 IMPLANT
GAUZE SPONGE 4X4 12PLY STRL (GAUZE/BANDAGES/DRESSINGS) ×3 IMPLANT
GLOVE BIO SURGEON STRL SZ7 (GLOVE) ×3 IMPLANT
GLOVE BIO SURGEON STRL SZ8 (GLOVE) ×3 IMPLANT
GLOVE BIOGEL PI IND STRL 7.0 (GLOVE) ×1 IMPLANT
GLOVE BIOGEL PI IND STRL 8 (GLOVE) ×2 IMPLANT
GLOVE BIOGEL PI INDICATOR 7.0 (GLOVE) ×2
GLOVE BIOGEL PI INDICATOR 8 (GLOVE) ×4
GOWN STRL REUS W/TWL LRG LVL3 (GOWN DISPOSABLE) ×6 IMPLANT
KIT BASIN OR (CUSTOM PROCEDURE TRAY) ×3 IMPLANT
KIT TURNOVER KIT A (KITS) IMPLANT
MANIFOLD NEPTUNE II (INSTRUMENTS) ×3 IMPLANT
NDL SAFETY ECLIPSE 18X1.5 (NEEDLE) ×2 IMPLANT
NEEDLE HYPO 18GX1.5 SHARP (NEEDLE) ×6
NEEDLE MA TROC 1/2 (NEEDLE) IMPLANT
NS IRRIG 1000ML POUR BTL (IV SOLUTION) ×3 IMPLANT
PACK TOTAL JOINT (CUSTOM PROCEDURE TRAY) ×3 IMPLANT
PASSER SUT SWANSON 36MM LOOP (INSTRUMENTS) IMPLANT
PENCIL SMOKE EVACUATOR (MISCELLANEOUS) IMPLANT
PROTECTOR NERVE ULNAR (MISCELLANEOUS) ×3 IMPLANT
STAPLER VISISTAT 35W (STAPLE) IMPLANT
STRIP CLOSURE SKIN 1/2X4 (GAUZE/BANDAGES/DRESSINGS) ×2 IMPLANT
SUT ETHIBOND NAB CT1 #1 30IN (SUTURE) IMPLANT
SUT MNCRL AB 4-0 PS2 18 (SUTURE) ×3 IMPLANT
SUT VIC AB 1 CT1 36 (SUTURE) ×3 IMPLANT
SUT VIC AB 2-0 CT1 27 (SUTURE) ×6
SUT VIC AB 2-0 CT1 TAPERPNT 27 (SUTURE) ×2 IMPLANT
SYR 30ML LL (SYRINGE) ×6 IMPLANT
TOWEL OR 17X26 10 PK STRL BLUE (TOWEL DISPOSABLE) ×6 IMPLANT

## 2020-01-06 NOTE — Discharge Instructions (Signed)
Elizabeth Arabian, MD [facility], [place] [address], [place], [place] [address] [phone]  BURSECTOMY / [place]  . Remove items at home which could result in a fall. This includes throw rugs or furniture in walking pathways.   ICE to the affected hip every three hours for 30 minutes at a time and then as needed for pain and swelling. Continue to use ice on the hip for pain and swelling from surgery. You may notice swelling that will progress down to the foot and ankle. This is normal after surgery. Elevate the leg when you are not up walking on it.    Continue to use the breathing machine which will help keep your temperature down. It is common for your temperature to cycle up and down following surgery, especially at night when you are not up moving around and exerting yourself. The breathing machine keeps your lungs expanded and your temperature down. . Sit on high chairs which makes it easier to stand.  . Sit on chairs with arms. Use the chair arms to help push yourself up when arising.   No active abduction of the leg (do not pull leg out to the side away from the body).  Use your walker for first several days until comfortable ambulating.  DIET You may resume your previous home diet once you are discharged from the hospital.  DRESSING / WOUND CARE / SHOWERING . You will have an adhesive waterproof bandage over the incision. Leave this in place until your first follow-up appointment. . You may shower three days after surgery, but do not submerge the incision under water.   ACTIVITY . Walk with your walker as instructed. Marland Kitchen Use walker as long as suggested by your caregivers. . Avoid periods of inactivity such as sitting longer than an hour when not asleep. This helps prevent blood clots.  . You may resume a sexual relationship in one month or when given the OK by your  doctor.  . You may return to work once you are cleared by your doctor.  . Do not drive a car for 6 weeks or until released by you surgeon.  . Do not drive while taking narcotics.  WEIGHT BEARING Weight bearing as tolerated with assist device (walker, cane, etc) as directed, use it until comfortable ambulating.  POSTOPERATIVE CONSTIPATION PROTOCOL Constipation - defined medically as fewer than three stools per week and severe constipation as less than one stool per week.  One of the most common issues patients have following surgery is constipation.  Even if you have a regular bowel pattern at home, your normal regimen is likely to be disrupted due to multiple reasons following surgery.  Combination of anesthesia, postoperative narcotics, change in appetite and fluid intake all can affect your bowels.  In order to avoid complications following surgery, here are some recommendations in order to help you during your recovery period.  Colace (docusate) - Pick up an over-the-counter form of Colace or another stool softener and take twice a day as long as you are requiring postoperative pain medications.  Take with a full glass of water daily.  If you experience loose stools or diarrhea, hold the colace until you stool forms back up.  If your symptoms do not get better within 1 week or if they get worse, check with your doctor.  Dulcolax (bisacodyl) - Pick up over-the-counter and take as directed by the product packaging  as needed to assist with the movement of your bowels.  Take with a full glass of water.  Use this product as needed if not relieved by Colace only.   MiraLax (polyethylene glycol) - Pick up over-the-counter to have on hand.  MiraLax is a solution that will increase the amount of water in your bowels to assist with bowel movements.  Take as directed and can mix with a glass of water, juice, soda, coffee, or tea.  Take if you go more than two days without a movement. Do not use MiraLax  more than once per day. Call your doctor if you are still constipated or irregular after using this medication for 7 days in a row.  If you continue to have problems with postoperative constipation, please contact the office for further assistance and recommendations.  If you experience "the worst abdominal pain ever" or develop nausea or vomiting, please contact the office immediatly for further recommendations for treatment.  ITCHING  If you experience itching with your medications, try taking only a single pain pill, or even half a pain pill at a time.  You can also use Benadryl over the counter for itching or also to help with sleep.   TED HOSE STOCKINGS Wear the elastic stockings on both legs for three weeks following surgery during the day but you may remove then at night for sleeping.  MEDICATIONS See your medication summary on the "After Visit Summary" that the nursing staff will review with you prior to discharge.  You may have some home medications which will be placed on hold until you complete the course of blood thinner medication.  It is important for you to complete the blood thinner medication as prescribed by your surgeon.  Continue your approved medications as instructed at time of discharge.  PRECAUTIONS If you experience chest pain or shortness of breath - call 911 immediately for transfer to the hospital emergency department.  If you develop a fever greater that 101 F, purulent drainage from wound, increased redness or drainage from wound, foul odor from the wound/dressing, or calf pain - CONTACT YOUR SURGEON.                                                   FOLLOW-UP APPOINTMENTS Make sure you keep all of your appointments after your operation with your surgeon and caregivers. You should call the office at the above phone number and make an appointment for approximately two weeks after the date of your surgery or on the date instructed by your surgeon outlined in the "After  Visit Summary".   Pick up stool softner and laxative for home use following surgery while on pain medications. Do not submerge incision under water. Please use good hand washing techniques while changing dressing each day. May shower starting three days after surgery. Please use a clean towel to pat the incision dry following showers. Continue to use ice for pain and swelling after surgery. Do not use any lotions or creams on the incision until instructed by your surgeon.

## 2020-01-06 NOTE — Transfer of Care (Signed)
Immediate Anesthesia Transfer of Care Note  Patient: Elizabeth Harvey  Procedure(s) Performed: LEFT HIP BURSECTOMY, GLUTEAL TENDON REPAIR (Left Hip)  Patient Location: PACU  Anesthesia Type:General  Level of Consciousness: awake  Airway & Oxygen Therapy: Patient Spontanous Breathing and Patient connected to face mask oxygen  Post-op Assessment: Report given to RN and Post -op Vital signs reviewed and stable  Post vital signs: Reviewed and stable  Last Vitals:  Vitals Value Taken Time  BP    Temp    Pulse    Resp    SpO2      Last Pain:  Vitals:   01/06/20 1331  TempSrc:   PainSc: 1       Patients Stated Pain Goal: 2 ([id number])  Complications: No apparent anesthesia complications

## 2020-01-06 NOTE — Anesthesia Postprocedure Evaluation (Signed)
Anesthesia Post Note  Patient: Krystl Zuker Kloos  Procedure(s) Performed: LEFT HIP BURSECTOMY, GLUTEAL TENDON REPAIR (Left Hip)     Patient location during evaluation: PACU Anesthesia Type: General Level of consciousness: awake and alert Pain management: pain level controlled Vital Signs Assessment: post-procedure vital signs reviewed and stable Respiratory status: spontaneous breathing, nonlabored ventilation, respiratory function stable and patient connected to nasal cannula oxygen Cardiovascular status: blood pressure returned to baseline and stable Postop Assessment: no apparent nausea or vomiting Anesthetic complications: no    Last Vitals:  Vitals:   01/06/20 1244  BP: 121/70  Pulse: 72  Resp: 18  Temp: 37 C  SpO2: 97%    Last Pain:  Vitals:   01/06/20 1331  TempSrc:   PainSc: 1                  Dickie Labarre L Marcellis Frampton

## 2020-01-06 NOTE — Op Note (Signed)
[address] MEDICAL RECORD [id number] ACCOUNT [id number] DATE OF BIRTH:05-04-1961 FACILITY: [facility] LOCATION: [facility] PHYSICIAN:Siomara Burkel Zella Ball, MD  OPERATIVE REPORT  DATE OF PROCEDURE:  01/06/2020  PREOPERATIVE DIAGNOSIS:  Left hip intractable bursitis with gluteal tendon tear.  POSTOPERATIVE DIAGNOSIS:  Left hip intractable bursitis with gluteal tendon tear.  PROCEDURE:  Left hip bursectomy with gluteal tendon repair.  SURGEON:  Gaynelle Arabian, MD  ASSISTANT:  Griffith Citron, PA-C  ANESTHESIA:  General.  ESTIMATED BLOOD LOSS:  Minimal.  DRAINS:  None.  COMPLICATIONS:  None.  CONDITION:  Stable to recovery.  BRIEF CLINICAL NOTE:  The patient is a 59 year old female with significant persistent lateral hip pain refractory to nonoperative management.  MRI showed a gluteal tendon tear.  She presents now for bursectomy and gluteal tendon repair.  PROCEDURE IN DETAIL:  After successful administration of general anesthetic, the patient was placed in the right lateral decubitus position with the left side up and held with a hip positioner.  Left lower extremity was prepped and draped in the usual  sterile fashion.  Longitudinal incision was made, centered over the tip of the greater trochanter about 4 inches in length.  Skin was cut with a 10 blade through subcutaneous tissue to the fascia lata, which was incised in line with the skin incision.   There was significant thickening of the bursa and the bursa was excised.  There was no obvious full thickness tear of the gluteus medius tendon, but there was a palpable spot where there was a defect present.  I made a vertical split in the tendon at  this level.  It did show that a part of the tendon was detached from the greater trochanter, but had not retracted.  I placed a Mitek anchor into the greater trochanter and passed the ends of the suture into the free ends of the tendon and the tendon was  advanced back onto the greater  trochanter and oversewn.  This was felt to be a very stable repair.  The wound was copiously irrigated with saline solution.  Thirty mL of 0.25% Marcaine were injected into the fascia lata,  the subcutaneous tissues.   Fascia lata was closed with interrupted #1 Vicryl.  I left open a small triangular area of tissue over the tip of the greater trochanter so as to prevent friction over the repair.  The subcutaneous tissue was then closed with interrupted 2-0 Vicryl and a  subcuticular running 4-0 Monocryl.  Incisions cleaned and dried and Steri-Strips and a bulky sterile dressing applied.  She was awakened and transported to recovery in stable condition.  VN/NUANCE  D:01/06/2020 T:01/06/2020 JOB:[id number]

## 2020-01-06 NOTE — Anesthesia Procedure Notes (Signed)
Procedure Name: Intubation Date/Time: 01/06/2020 3:26 PM Performed by: Sharlette Dense, CRNA Patient Re-evaluated:Patient Re-evaluated prior to induction Oxygen Delivery Method: Circle system utilized Preoxygenation: Pre-oxygenation with 100% oxygen Induction Type: IV induction Ventilation: Mask ventilation without difficulty and Oral airway inserted - appropriate to patient size Laryngoscope Size: Sabra Heck and 2 Grade View: Grade I Tube type: Oral Tube size: 7.5 mm Number of attempts: 1 Airway Equipment and Method: Stylet Placement Confirmation: ETT inserted through vocal cords under direct vision and positive ETCO2 Secured at: 22 cm Tube secured with: Tape Dental Injury: Teeth and Oropharynx as per pre-operative assessment

## 2020-01-06 NOTE — Progress Notes (Signed)
Called patient's husband and let him know that patient is in surgery. The next person to call him is Dr Wynelle Link. He verbalizes understanding.

## 2020-01-06 NOTE — Anesthesia Preprocedure Evaluation (Addendum)
Anesthesia Evaluation  Patient identified by MRN, date of birth, ID band Patient awake    Reviewed: Allergy & Precautions, NPO status , Patient's Chart, lab work & pertinent test results  History of Anesthesia Complications (+) PONV  Airway Mallampati: II  TM Distance: >3 FB Neck ROM: Full    Dental no notable dental hx. (+) Teeth Intact, Dental Advisory Given   Pulmonary neg pulmonary ROS, former smoker,    Pulmonary exam normal breath sounds clear to auscultation       Cardiovascular negative cardio ROS Normal cardiovascular exam Rhythm:Regular Rate:Normal     Neuro/Psych Anxiety negative neurological ROS  negative psych ROS   GI/Hepatic Neg liver ROS, GERD  ,  Endo/Other  negative endocrine ROS  Renal/GU negative Renal ROS  negative genitourinary   Musculoskeletal negative musculoskeletal ROS (+)   Abdominal   Peds  Hematology negative hematology ROS (+)   Anesthesia Other Findings   Reproductive/Obstetrics                            Anesthesia Physical Anesthesia Plan  ASA: II  Anesthesia Plan: General   Post-op Pain Management:    Induction: Intravenous  PONV Risk Score and Plan: 4 or greater and Midazolam, Dexamethasone, Ondansetron, Scopolamine patch - Pre-op and Treatment may vary due to age or medical condition  Airway Management Planned: Oral ETT  Additional Equipment:   Intra-op Plan:   Post-operative Plan: Extubation in OR  Informed Consent: I have reviewed the patients History and Physical, chart, labs and discussed the procedure including the risks, benefits and alternatives for the proposed anesthesia with the patient or authorized representative who has indicated his/her understanding and acceptance.     Dental advisory given  Plan Discussed with: CRNA  Anesthesia Plan Comments:         Anesthesia Quick Evaluation

## 2020-01-06 NOTE — H&P (Signed)
CC- Elizabeth Harvey is a 59 y.o. adult who presents with left hip pain  Hip Pain: Patient complains of left hip pain. Onset of the symptoms was several months ago. Inciting event: none. Current symptoms include lateral hip pain. Associated symptoms: none. Aggravating symptoms: lateral movements, pivoting, rising after sitting and walking.  Patient has had no prior hip problems. Evaluation to date: plain films, which were normal and MRI with gluteal tendon tear.  Treatment to date: home exercise program, which has been not very effective and injection which provided partial temporary relief.  Past Medical History:  Diagnosis Date  . Anxiety   . GERD (gastroesophageal reflux disease)    occasional  . Mental disorder   . Plantar fasciitis of right foot    has orthotics for shoe  . PONV (postoperative nausea and vomiting) 20 yrs ago   had to be put all way to sleep     Past Surgical History:  Procedure Laterality Date  . BREAST REDUCTION SURGERY Bilateral 03/03/2017   Procedure: BILATERAL MAMMARY REDUCTION  (BREAST);  Surgeon: Irene Limbo, MD;  Location: [facility];  Service: Plastics;  Laterality: Bilateral;  . CESAREAN SECTION  20 yrs ago  . KNEE ARTHROSCOPY Bilateral 04/15/2013   Procedure: ARTHROSCOPY KNEE BILATERAL WITH DEBRIDEMENT;  Surgeon: Gearlean Alf, MD;  Location: [facility];  Service: Orthopedics;  Laterality: Bilateral;  Medial meniscal debridement and chondroplasty bilateral knees  . REDUCTION MAMMAPLASTY Bilateral 2018   breast lift    Prior to Admission medications   Medication Sig Start Date End Date Taking? Authorizing Provider  diclofenac sodium (VOLTAREN) 1 % GEL Apply 1 application topically 4 (four) times daily as needed (pain).    Yes [provider]  doxylamine, Sleep, (UNISOM) 25 MG tablet Take 6.25 mg by mouth at bedtime as needed for sleep.   Yes [provider]  ibuprofen (ADVIL) 800 MG tablet Take 800 mg by mouth 2 (two) times  daily as needed for moderate pain.   Yes [provider]  LORazepam (ATIVAN) 1 MG tablet Take 0.5-1 mg by mouth daily as needed for anxiety.    Yes [provider]  tiZANidine (ZANAFLEX) 4 MG tablet Take 4 mg by mouth daily as needed for muscle spasms.   Yes [provider]    Physical Examination: General appearance - alert, well appearing, and in no distress Mental status - alert, oriented to person, place, and time Chest - clear to auscultation, no wheezes, rales or rhonchi, symmetric air entry Heart - normal rate, regular rhythm, normal S1, S2, no murmurs, rubs, clicks or gallops Abdomen - soft, nontender, nondistended, no masses or organomegaly Neurological - alert, oriented, normal speech, no focal findings or movement disorder noted  A left hip exam was performed. GENERAL: no acute distress SKIN: intact SWELLING: none WARMTH: no warmth TENDERNESS: maximal at greater trochanter ROM: normal STRENGTH: 5/5 except hip abduction which is 4/5 GAIT: antalgic  ASSESSMENT:Left hip intractable bursitis with gluteal tendon tear  Plan Left hip bursectomy and gluteal tendon repair. Discussed in detail with the patient who elects to proceed  Dione Plover. Xavier Munger, MD    01/06/2020, 7:01 AM

## 2020-01-06 NOTE — Brief Op Note (Signed)
01/06/2020  4:20 PM  PATIENT:  Elizabeth Harvey  59 y.o. adult  PRE-OPERATIVE DIAGNOSIS:  Left hip bursitis; gluteal tendon tear  POST-OPERATIVE DIAGNOSIS:  Left hip bursitis; gluteal tendon tear  PROCEDURE:  Procedure(s): LEFT HIP BURSECTOMY, GLUTEAL TENDON REPAIR (Left)  SURGEON:  Surgeon(s) and Role:    Gaynelle Arabian, MD - Primary  PHYSICIAN ASSISTANT:   ASSISTANTS: Griffith Citron,, PA-C   ANESTHESIA:   general  EBL:  25 mL   DRAINS: none   LOCAL MEDICATIONS USED:  MARCAINE     COUNTS:  YES  TOURNIQUET:  * No tourniquets in log *  DICTATION: .Other Dictation: Dictation Number [id number]  PLAN OF CARE: Discharge to home after PACU  PATIENT DISPOSITION:  PACU - hemodynamically stable.

## 2020-01-09 ENCOUNTER — Encounter: Payer: Self-pay | Admitting: *Deleted

## 2020-01-14 ENCOUNTER — Encounter: Payer: Self-pay | Admitting: *Deleted

## 2020-02-17 ENCOUNTER — Other Ambulatory Visit (HOSPITAL_COMMUNITY)
Admission: RE | Admit: 2020-02-17 | Discharge: 2020-02-17 | Disposition: A | Discharge status: Home / Self Care | Payer: BC Managed Care – PPO | Source: Ambulatory Visit | Attending: Family Medicine | Admitting: Family Medicine

## 2020-02-17 ENCOUNTER — Other Ambulatory Visit: Payer: Self-pay | Admitting: Family Medicine

## 2020-02-17 DIAGNOSIS — K649 Unspecified hemorrhoids: Secondary | ICD-10-CM | POA: Diagnosis not present

## 2020-02-17 DIAGNOSIS — Z124 Encounter for screening for malignant neoplasm of cervix: Secondary | ICD-10-CM | POA: Insufficient documentation

## 2020-02-17 DIAGNOSIS — Z23 Encounter for immunization: Secondary | ICD-10-CM | POA: Diagnosis not present

## 2020-02-17 DIAGNOSIS — B372 Candidiasis of skin and nail: Secondary | ICD-10-CM | POA: Diagnosis not present

## 2020-02-17 DIAGNOSIS — Z0000 Encounter for general adult medical examination without abnormal findings: Secondary | ICD-10-CM | POA: Diagnosis not present

## 2020-02-17 DIAGNOSIS — E785 Hyperlipidemia, unspecified: Secondary | ICD-10-CM | POA: Diagnosis not present

## 2020-02-19 LAB — CYTOLOGY - PAP
Comment: NEGATIVE
Diagnosis: NEGATIVE
High risk HPV: NEGATIVE

## 2020-03-25 ENCOUNTER — Ambulatory Visit (INDEPENDENT_AMBULATORY_CARE_PROVIDER_SITE_OTHER): Payer: BC Managed Care – PPO

## 2020-03-25 ENCOUNTER — Other Ambulatory Visit: Payer: Self-pay | Admitting: Podiatry

## 2020-03-25 ENCOUNTER — Other Ambulatory Visit: Payer: Self-pay

## 2020-03-25 ENCOUNTER — Ambulatory Visit: Payer: BC Managed Care – PPO | Admitting: Podiatry

## 2020-03-25 DIAGNOSIS — D3610 Benign neoplasm of peripheral nerves and autonomic nervous system, unspecified: Secondary | ICD-10-CM

## 2020-03-25 DIAGNOSIS — M779 Enthesopathy, unspecified: Secondary | ICD-10-CM

## 2020-03-25 DIAGNOSIS — M79672 Pain in left foot: Secondary | ICD-10-CM | POA: Diagnosis not present

## 2020-03-25 DIAGNOSIS — M79671 Pain in right foot: Secondary | ICD-10-CM

## 2020-03-25 DIAGNOSIS — G5762 Lesion of plantar nerve, left lower limb: Secondary | ICD-10-CM

## 2020-03-25 NOTE — Progress Notes (Signed)
Subjective:   Patient ID: Elizabeth Harvey, adult   DOB: 59 y.o.   MRN: [id number]   HPI Patient states she has had burning and tingling in her toes that is been present for years but over the last 6 months it seems to have worsened and it is also bad when she gets up in the morning.  Patient had hip surgery 2 months ago when she tried to stay off her foot but it continues to bother her and patient does not smoke likes to be active   Review of Systems  All other systems reviewed and are negative.       Objective:  Physical Exam Vitals and nursing note reviewed.  Constitutional:      Appearance: She is well-developed.  Pulmonary:     Effort: Pulmonary effort is normal.  Musculoskeletal:        General: Normal range of motion.  Skin:    General: Skin is warm.  Neurological:     Mental Status: She is alert.     Neurovascular status intact muscle strength found to be adequate range of motion within normal limits.  Patient is noted to have significant positive Mulder sign left third interspace with radiating discomfort into the adjacent digits and is noted to have inflammation pain around the third MPJ right over left foot.  Patient has good digital perfusion well oriented x3 F2     Assessment:  Difficult to tell between neuroma symptomatology versus inflammatory capsulitis or combination of the 2     Plan:  H&P both discussed.  At this point the left I did sterile prep and injected the intermetatarsal space with a combination of Kenalog dexamethasone Xylocaine Marcaine and I want her to test this over the next day and for the right I did a forefoot block I aspirated the third MPJ getting out a small amount of clear fluid injected quarter cc dexamethasone Kenalog and applied padding.  Reappoint 2 weeks and may end up requiring neuroma excision depending on the results of these 2 weeks  X-rays indicate that there is slight elongation of the third metatarsal segment but no other pathology  was noted

## 2020-03-25 NOTE — Patient Instructions (Signed)

## 2020-04-09 ENCOUNTER — Ambulatory Visit: Payer: BC Managed Care – PPO | Admitting: Podiatry

## 2020-04-09 ENCOUNTER — Other Ambulatory Visit: Payer: Self-pay

## 2020-04-09 DIAGNOSIS — D3610 Benign neoplasm of peripheral nerves and autonomic nervous system, unspecified: Secondary | ICD-10-CM

## 2020-04-09 DIAGNOSIS — M779 Enthesopathy, unspecified: Secondary | ICD-10-CM | POA: Diagnosis not present

## 2020-04-09 NOTE — Progress Notes (Signed)
Subjective:   Patient ID: Elizabeth Harvey, adult   DOB: 59 y.o.   MRN: [id number]   HPI Patient states I have improved quite a bit with treatment but I am starting to get pain again right over left.  It feels like the joint of the right and it feels more nervy on the left and I am better than before but I feel like I am sliding backwards   ROS      Objective:  Physical Exam  Neurovascular status intact with what appears to be inflammation pain of the third MPJ right with mild prominence of the bone structure and on the left but appears to be more neuroma-like symptomatology third interspace with difficulty being able to separate the 2     Assessment:  Inflammatory capsulitis third MPJ right with neuroma symptomatology left     Plan:  H&P reviewed both conditions and at this point I recommended orthotics to try to diffuse weight off the metatarsal phalangeal joint and also to reduce neuroma-like symptomatology.  I did discuss possibility for surgical intervention in this case but we can continue to treat conservatively and ped Ortho saw this patient with orthotics done today

## 2020-04-14 DIAGNOSIS — M79671 Pain in right foot: Secondary | ICD-10-CM | POA: Diagnosis not present

## 2020-04-30 ENCOUNTER — Ambulatory Visit: Payer: BC Managed Care – PPO | Admitting: Orthotics

## 2020-04-30 ENCOUNTER — Other Ambulatory Visit: Payer: Self-pay

## 2020-04-30 DIAGNOSIS — D3610 Benign neoplasm of peripheral nerves and autonomic nervous system, unspecified: Secondary | ICD-10-CM

## 2020-04-30 DIAGNOSIS — M779 Enthesopathy, unspecified: Secondary | ICD-10-CM

## 2020-04-30 NOTE — Progress Notes (Signed)
Patient came in today to pick up custom made foot orthotics.  The goals were accomplished and the patient reported no dissatisfaction with said orthotics.  Patient was advised of breakin period and how to report any issues. 

## 2022-03-08 ENCOUNTER — Other Ambulatory Visit: Payer: Self-pay | Admitting: Family Medicine

## 2022-03-08 DIAGNOSIS — Z1231 Encounter for screening mammogram for malignant neoplasm of breast: Secondary | ICD-10-CM

## 2022-03-16 ENCOUNTER — Ambulatory Visit
Admission: RE | Admit: 2022-03-16 | Discharge: 2022-03-16 | Disposition: A | Discharge status: Home / Self Care | Payer: BC Managed Care – PPO | Source: Ambulatory Visit | Attending: Family Medicine | Admitting: Family Medicine

## 2022-03-16 DIAGNOSIS — Z1231 Encounter for screening mammogram for malignant neoplasm of breast: Secondary | ICD-10-CM

## 2023-02-13 ENCOUNTER — Ambulatory Visit: Payer: 59 | Admitting: Family

## 2023-02-13 ENCOUNTER — Encounter: Payer: Self-pay | Admitting: Family

## 2023-02-13 VITALS — BP 131/84 | HR 84 | Temp 98.2°F | Ht 65.0 in | Wt 224.8 lb

## 2023-02-13 DIAGNOSIS — G8929 Other chronic pain: Secondary | ICD-10-CM | POA: Diagnosis not present

## 2023-02-13 DIAGNOSIS — F418 Other specified anxiety disorders: Secondary | ICD-10-CM | POA: Diagnosis not present

## 2023-02-13 DIAGNOSIS — M545 Low back pain, unspecified: Secondary | ICD-10-CM

## 2023-02-13 MED ORDER — LORAZEPAM 1 MG PO TABS: 0.5000 mg | ORAL_TABLET | Freq: Every day | ORAL | 2 refills | Status: AC | PRN

## 2023-02-13 MED ORDER — TIZANIDINE HCL 4 MG PO TABS: 4.0000 mg | ORAL_TABLET | Freq: Every day | ORAL | 5 refills | Status: DC | PRN

## 2023-02-13 MED ORDER — IBUPROFEN 800 MG PO TABS: 800.0000 mg | ORAL_TABLET | Freq: Three times a day (TID) | ORAL | 5 refills | Status: AC

## 2023-02-13 NOTE — Progress Notes (Signed)
New Patient Office Visit  Subjective:  Patient ID: Elizabeth Harvey, adult    DOB: September 26, 1961  Age: 62 y.o. MRN: [id number]  CC:  Chief Complaint  Patient presents with   New Patient (Initial Visit)        Anxiety    Medication refill of Lorazepam.    Back Pain    Pt c/o lower back pain, currently taking tizanidine and ibuprofen which does help.     HPI: Elizabeth Harvey presents for establishing care today.  Anxiety:   has taken Lorazepam for years just as needed, averages about 1-2 pills per week. Reports her dtr and new grandbaby are living with her & husb right now and causing difficulty with sleep making her more stressed during the day.  Back pain:  lumbar area, has seen [facility] in past and had imaging done. Reports having fluoroscopy procedures in past. Has been taking Tizanidine and Ibuprofen prn and so far doing ok.   Assessment & Plan:  Other specified anxiety disorders Assessment & Plan: chronic has taken Lorazepam for years prn PDMP checked and verified controlled substance contract reviewed & signed  f/u in 3-6 mos - when refill needed  Orders: -     LORazepam; Take 0.5-1 tablets (0.5-1 mg total) by mouth daily as needed for anxiety.  Dispense: 30 tablet; Refill: 2  Chronic bilateral low back pain without sciatica Assessment & Plan: chronic also hx of THR and bilateral TKR thru [facility] taking Tizanidine  qhs and Ibuprofen  tid prn advised on long term effects of Ibuprofen, discussed switching to longer acting NSAID sending refills today f/u 6 mos  Orders: -     tiZANidine HCl; Take 1 tablet (4 mg total) by mouth daily as needed for muscle spasms.  Dispense: 30 tablet; Refill: 5 -     Ibuprofen; Take 1 tablet (800 mg total) by mouth 3 (three) times daily.  Dispense: 30 tablet; Refill: 5   Subjective:    Outpatient Medications Prior to Visit  Medication Sig Dispense Refill   doxylamine, Sleep, (UNISOM) 25 MG tablet Take 6.25 mg by mouth at  bedtime as needed for sleep.     nystatin (MYCOSTATIN/NYSTOP) powder      ibuprofen (ADVIL) 800 MG tablet Take 800 mg by mouth 3 (three) times daily.     LORazepam (ATIVAN) 1 MG tablet Take 0.5-1 mg by mouth daily as needed for anxiety.      methocarbamol (ROBAXIN) 500 MG tablet Take 1 tablet (500 mg total) by mouth every 6 (six) hours as needed for muscle spasms. 40 tablet 0   tiZANidine (ZANAFLEX) 4 MG tablet Take 4 mg by mouth daily as needed for muscle spasms.     No facility-administered medications prior to visit.   Past Medical History:  Diagnosis Date   Acute medial meniscal tear 04/15/2013   Anxiety    GERD (gastroesophageal reflux disease)    occasional   Mental disorder    Plantar fasciitis of right foot    has orthotics for shoe   PONV (postoperative nausea and vomiting) 20 yrs ago   had to be put all way to sleep    Past Surgical History:  Procedure Laterality Date   BREAST REDUCTION SURGERY Bilateral 03/03/2017   Procedure: BILATERAL MAMMARY REDUCTION  (BREAST);  Surgeon: Glenna Fellows, MD;  Location: [facility];  Service: Plastics;  Laterality: Bilateral;   CESAREAN SECTION  20 yrs ago   1994   KNEE ARTHROSCOPY  Bilateral 04/15/2013   Procedure: ARTHROSCOPY KNEE BILATERAL WITH DEBRIDEMENT;  Surgeon: Loanne Drilling, MD;  Location: [facility];  Service: Orthopedics;  Laterality: Bilateral;  Medial meniscal debridement and chondroplasty bilateral knees   OPEN SURGICAL REPAIR OF GLUTEAL TENDON Left 01/06/2020   Procedure: LEFT HIP BURSECTOMY, GLUTEAL TENDON REPAIR;  Surgeon: Ollen Gross, MD;  Location: [facility];  Service: Orthopedics;  Laterality: Left;   REDUCTION MAMMAPLASTY Bilateral 2018   breast lift    Objective:   Today's Vitals: BP 131/84 (BP Location: Left Arm, Patient Position: Sitting, Cuff Size: Normal)   Pulse 84   Temp 98.2 F (36.8 C) (Temporal)   Ht  (1.651 m)   Wt 224 lb 12.8 oz (102 kg)   SpO2 97%   BMI 37.41 kg/m    Physical Exam  Meds ordered this encounter  Medications   LORazepam (ATIVAN) 1 MG tablet    Sig: Take 0.5-1 tablets (0.5-1 mg total) by mouth daily as needed for anxiety.    Dispense:  30 tablet    Refill:  2    Order Specific Question:   Supervising Provider    Answer:   ANDY, CAMILLE L [[id number]]   tiZANidine (ZANAFLEX) 4 MG tablet    Sig: Take 1 tablet (4 mg total) by mouth daily as needed for muscle spasms.    Dispense:  30 tablet    Refill:  5    Order Specific Question:   Supervising Provider    Answer:   ANDY, CAMILLE L [[id number]]   ibuprofen (ADVIL) 800 MG tablet    Sig: Take 1 tablet (800 mg total) by mouth 3 (three) times daily.    Dispense:  30 tablet    Refill:  5    Order Specific Question:   Supervising Provider    Answer:   ANDY, CAMILLE L [[id number]]    Dulce Sellar, NP

## 2023-02-13 NOTE — Patient Instructions (Addendum)
Welcome to [facility], It was a pleasure meeting you today!    As discussed, I have sent your refills to your pharmacy.   Please schedule a 3-6 month follow up visit today for med refills and we can combine this with a physical and fasting labs.     PLEASE NOTE: If you had any LAB tests please let us know if you have not heard back within a few days. You may see your results on MyChart before we have a chance to review them but we will give you a call once they are reviewed by [place]. If we ordered any REFERRALS today, please let us know if you have not heard from their office within the next week.  Let us know through MyChart if you are needing REFILLS, or have your pharmacy send [place] the request. You can also use MyChart to communicate with me or any office staff.  Please try these tips to maintain a healthy lifestyle: It is important that you exercise regularly at least 30 minutes 5 times a week. Think about what you will eat, plan ahead. Choose whole foods, & think  "clean, green, fresh or frozen" over canned, processed or packaged foods which are more sugary, salty, and fatty. 70 to 75% of food eaten should be fresh vegetables and protein. 2-3  meals daily with healthy snacks between meals, but must be whole fruit, protein or vegetables. Aim to eat over a 10 hour period when you are active, for example, 7am to 5pm, and then STOP after your last meal of the day, drinking only water.  Shorter eating windows, 6-8 hours, are showing benefits in heart disease and blood sugar regulation. Drink water every day! Shoot for 64 ounces daily = 8 cups, no other drink is as healthy! Fruit juice is best enjoyed in a healthy way, by EATING the fruit.

## 2023-02-13 NOTE — Assessment & Plan Note (Signed)
chronic also hx of THR and bilateral TKR thru [facility] taking Tizanidine  qhs and Ibuprofen  tid prn advised on long term effects of Ibuprofen, discussed switching to longer acting NSAID sending refills today f/u 6 mos

## 2023-02-13 NOTE — Assessment & Plan Note (Signed)
chronic has taken Lorazepam for years prn PDMP checked and verified controlled substance contract reviewed & signed  f/u in 3-6 mos - when refill needed

## 2023-02-22 IMAGING — MG MM DIGITAL SCREENING BILAT W/ TOMO AND CAD
8 series · 8 of 24 positions shown · non-contrast
Comparison: Previous exam(s).

CLINICAL DATA: Screening.

EXAM:
DIGITAL SCREENING BILATERAL MAMMOGRAM WITH TOMOSYNTHESIS AND CAD
TECHNIQUE: Bilateral screening digital craniocaudal and mediolateral oblique
mammograms were obtained. Bilateral screening digital breast
tomosynthesis was performed. The images were evaluated with
computer-aided detection.

[R MLO synth-2D]
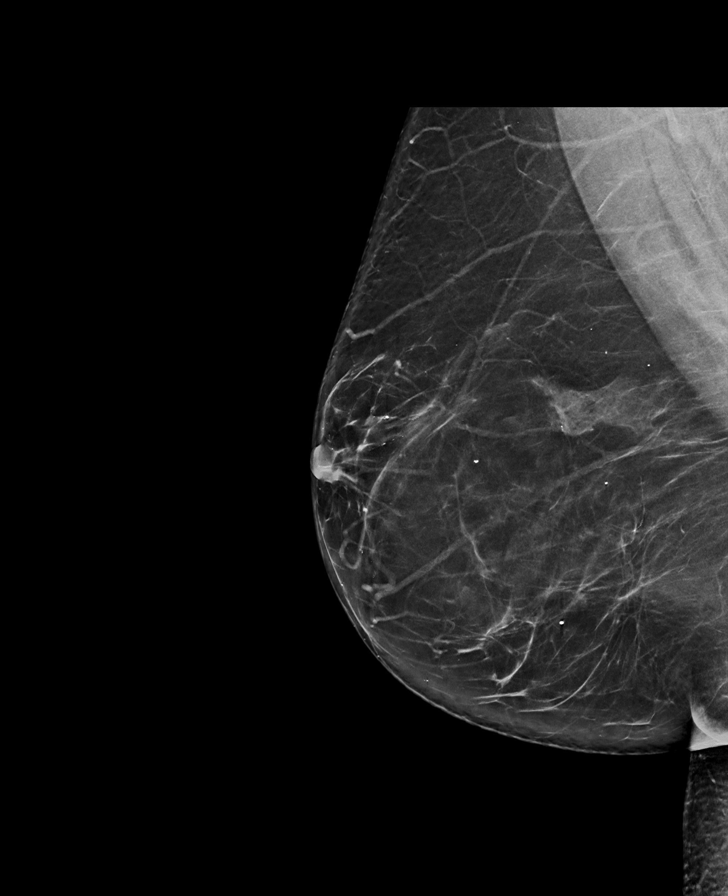

[L MLO synth-2D]
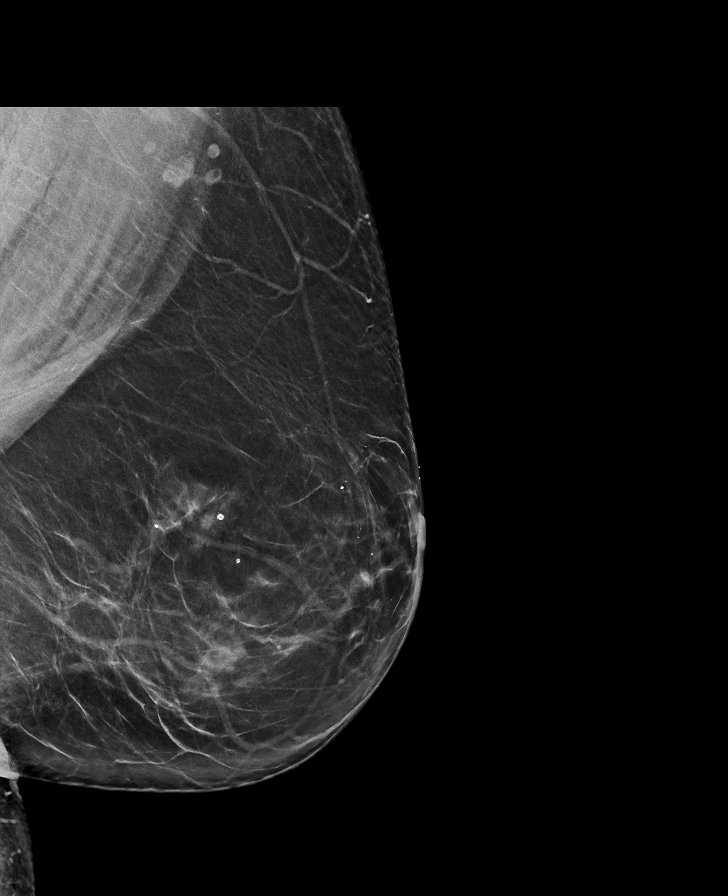

[R CC synth-2D]
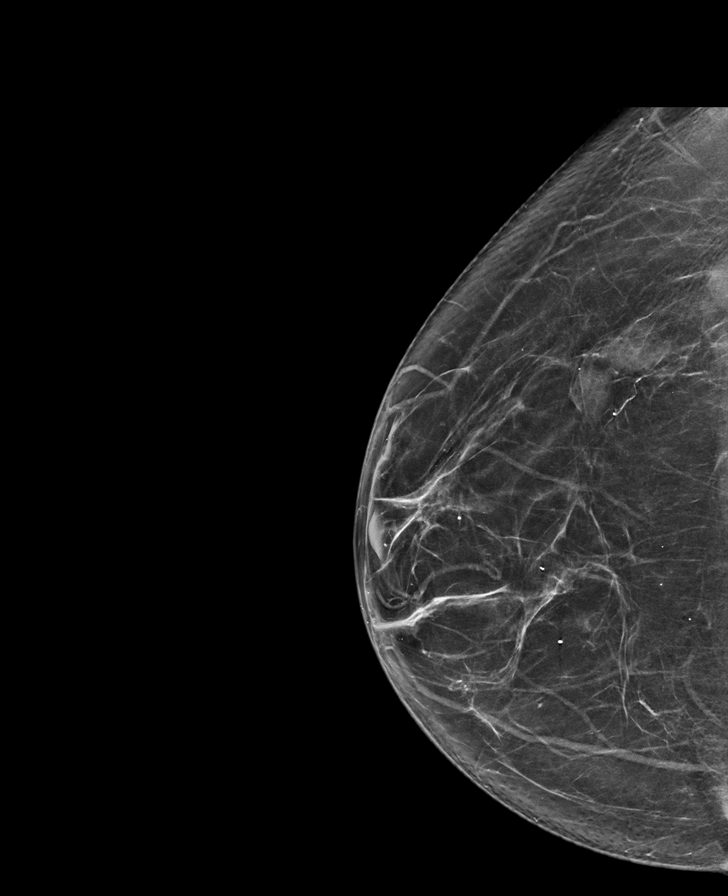

[L CC synth-2D]
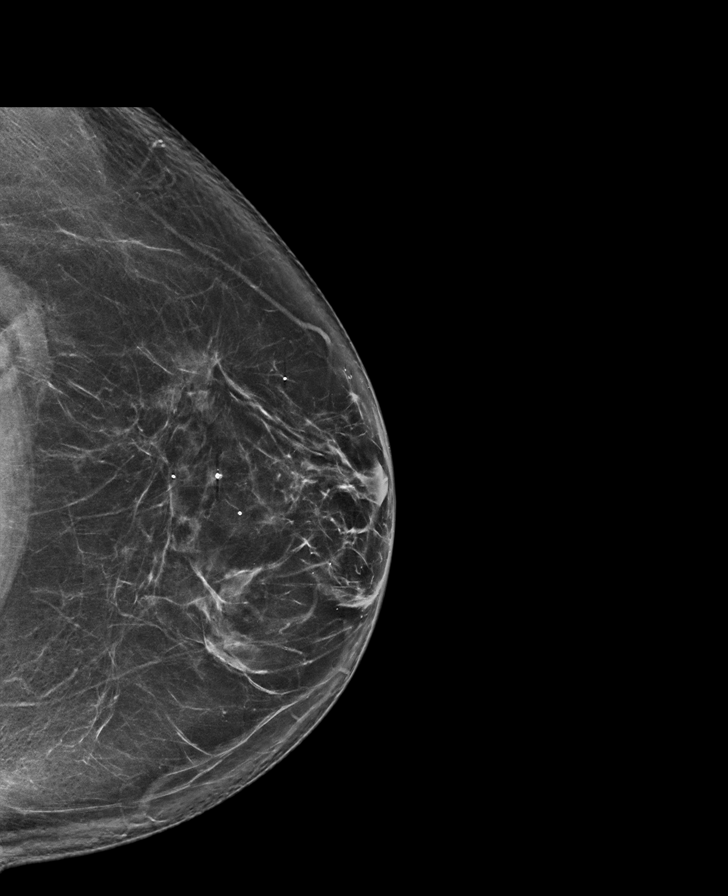

[L MLO tomo · tomo slice 43/84.0]
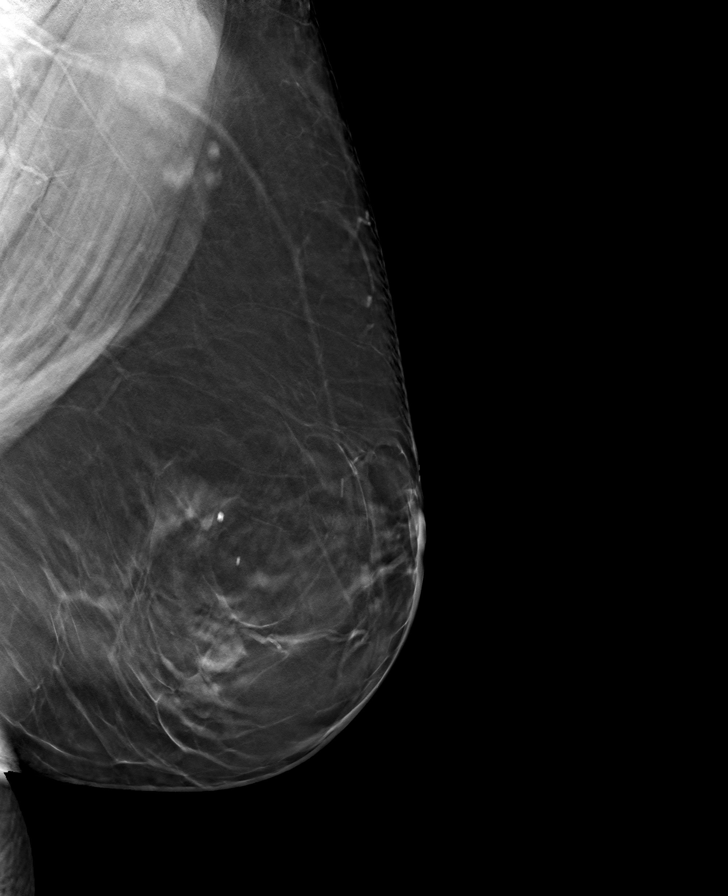

[R MLO tomo · tomo slice 42/83.0]
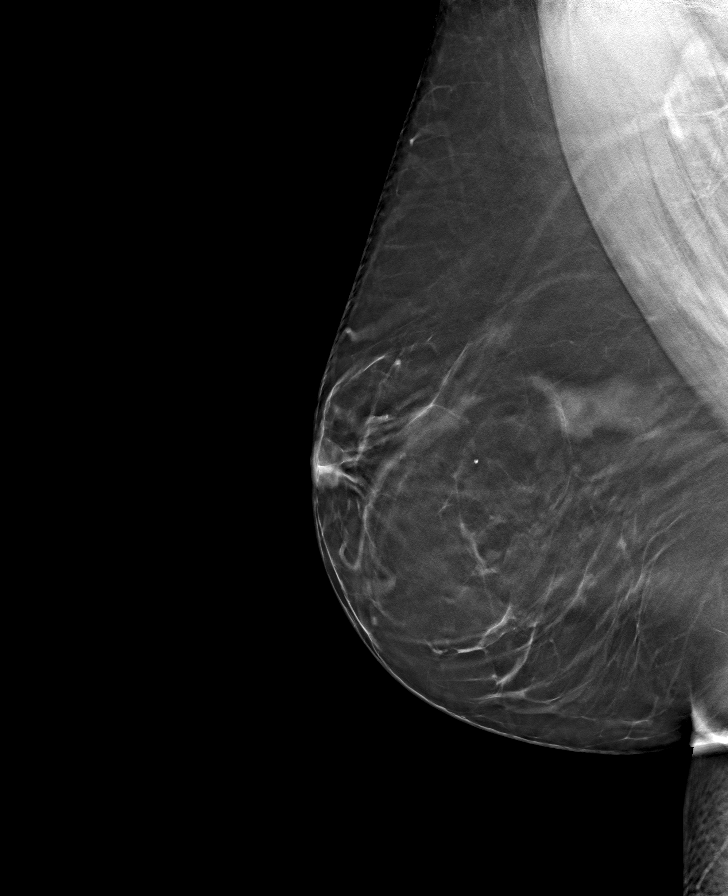

[R CC tomo · tomo slice 41/81.0]
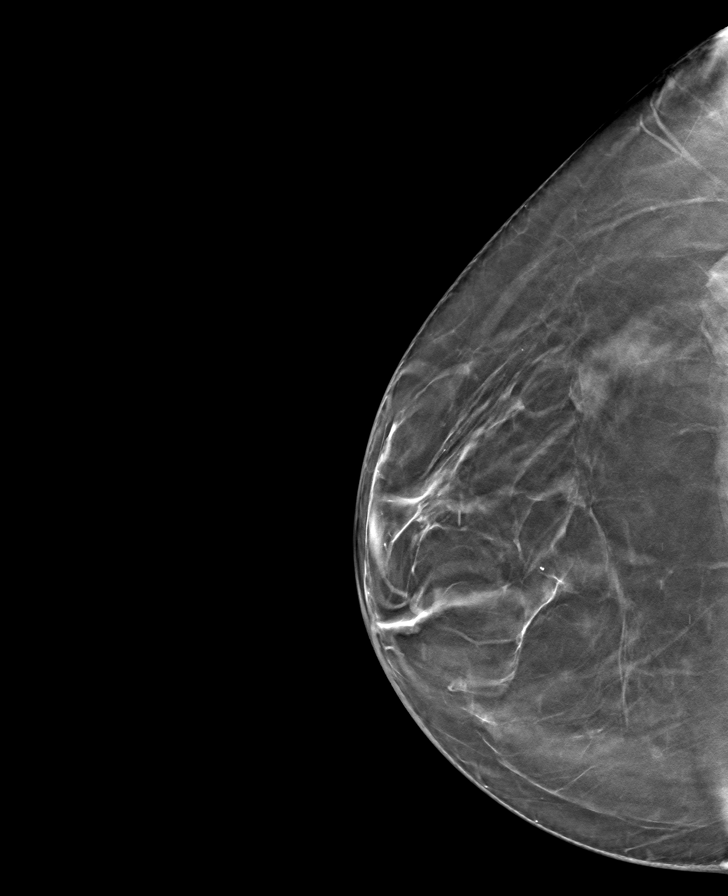

[L CC tomo · tomo slice 43/84.0]
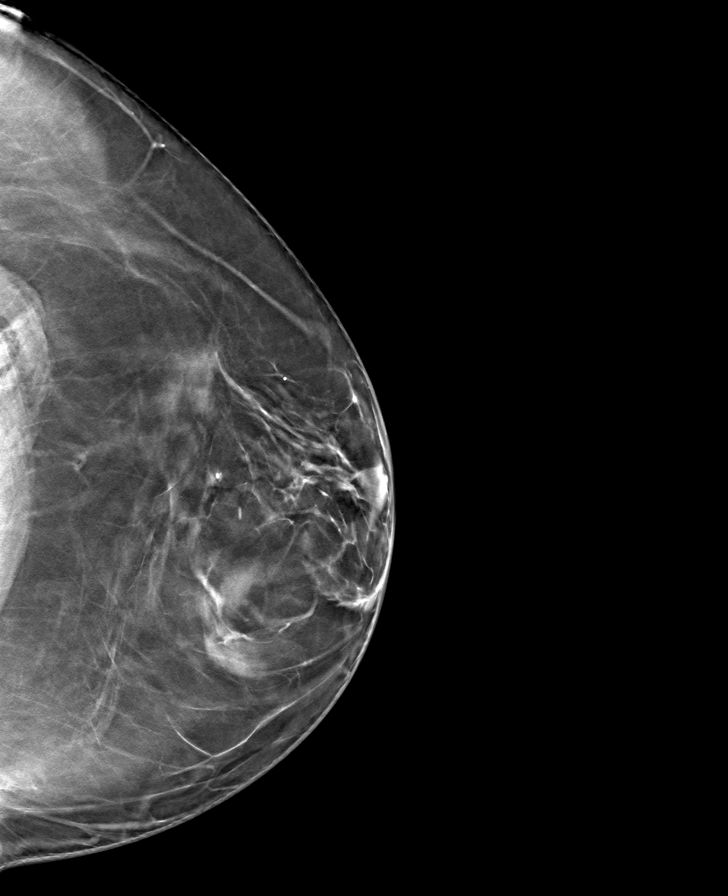

[8 of 24 positions shown; findings below may reference images not displayed]

ACR Breast Density Category b: There are scattered areas of
fibroglandular density.
FINDINGS: There are no findings suspicious for malignancy.
IMPRESSION: No mammographic evidence of malignancy. A result letter of this
screening mammogram will be mailed directly to the patient.

RECOMMENDATION:
Screening mammogram in one year. (Code:51-O-LD2)

BI-RADS CATEGORY  1: Negative.

## 2024-02-06 ENCOUNTER — Other Ambulatory Visit: Payer: Self-pay | Admitting: Family

## 2024-02-06 DIAGNOSIS — F418 Other specified anxiety disorders: Secondary | ICD-10-CM

## 2024-02-06 NOTE — Telephone Encounter (Signed)
 Needs OV.

## 2024-02-06 NOTE — Telephone Encounter (Signed)
 Last OV on 02/13/2023

## 2024-02-06 NOTE — Telephone Encounter (Signed)
needs OV

## 2024-03-07 ENCOUNTER — Other Ambulatory Visit: Payer: Self-pay | Admitting: Family

## 2024-03-07 DIAGNOSIS — G8929 Other chronic pain: Secondary | ICD-10-CM

## 2024-05-02 ENCOUNTER — Other Ambulatory Visit: Payer: Self-pay | Admitting: Family

## 2024-05-02 DIAGNOSIS — M545 Low back pain, unspecified: Secondary | ICD-10-CM
